# Patient Record
Sex: Female | Born: 1965 | Race: Asian | Hispanic: No | Marital: Married | Smoking: Never smoker
Health system: Southern US, Community
[De-identification: ages and names within clinical notes are randomized; demographics above are authoritative.]

---

## 2016-06-20 ENCOUNTER — Other Ambulatory Visit: Payer: Self-pay | Admitting: *Deleted

## 2016-06-20 DIAGNOSIS — Z1231 Encounter for screening mammogram for malignant neoplasm of breast: Secondary | ICD-10-CM

## 2016-07-01 ENCOUNTER — Ambulatory Visit
Admission: RE | Admit: 2016-07-01 | Discharge: 2016-07-01 | Disposition: A | Discharge status: Home / Self Care | Payer: BLUE CROSS/BLUE SHIELD | Source: Ambulatory Visit | Attending: *Deleted | Admitting: *Deleted

## 2016-07-01 DIAGNOSIS — Z1231 Encounter for screening mammogram for malignant neoplasm of breast: Secondary | ICD-10-CM

## 2017-03-10 DIAGNOSIS — N3090 Cystitis, unspecified without hematuria: Secondary | ICD-10-CM

## 2017-03-10 HISTORY — DX: Cystitis, unspecified without hematuria: N30.90

## 2017-05-31 ENCOUNTER — Other Ambulatory Visit: Payer: Self-pay | Admitting: *Deleted

## 2017-05-31 DIAGNOSIS — Z1231 Encounter for screening mammogram for malignant neoplasm of breast: Secondary | ICD-10-CM

## 2017-07-03 ENCOUNTER — Ambulatory Visit
Admission: RE | Admit: 2017-07-03 | Discharge: 2017-07-03 | Disposition: A | Discharge status: Home / Self Care | Payer: BLUE CROSS/BLUE SHIELD | Source: Ambulatory Visit | Attending: *Deleted | Admitting: *Deleted

## 2017-07-03 DIAGNOSIS — Z1231 Encounter for screening mammogram for malignant neoplasm of breast: Secondary | ICD-10-CM

## 2018-02-28 ENCOUNTER — Encounter (HOSPITAL_BASED_OUTPATIENT_CLINIC_OR_DEPARTMENT_OTHER): Payer: Self-pay | Admitting: *Deleted

## 2018-02-28 ENCOUNTER — Emergency Department (HOSPITAL_BASED_OUTPATIENT_CLINIC_OR_DEPARTMENT_OTHER)
Admission: EM | Admit: 2018-02-28 | Discharge: 2018-02-28 | Disposition: A | Discharge status: Home / Self Care | Payer: BLUE CROSS/BLUE SHIELD | Attending: Emergency Medicine | Admitting: Emergency Medicine

## 2018-02-28 ENCOUNTER — Other Ambulatory Visit: Payer: Self-pay

## 2018-02-28 DIAGNOSIS — J069 Acute upper respiratory infection, unspecified: Secondary | ICD-10-CM

## 2018-02-28 DIAGNOSIS — R070 Pain in throat: Secondary | ICD-10-CM | POA: Insufficient documentation

## 2018-02-28 MED ORDER — GUAIFENESIN-DM 100-10 MG/5ML PO SYRP: 5.0000 mL | ORAL_SOLUTION | Freq: Three times a day (TID) | ORAL | 0 refills | Status: DC | PRN

## 2018-02-28 MED FILL — SM TUSSIN DM SYRUP: 100-10 | 8 days supply | Qty: 118 | Fill #0

## 2018-02-28 NOTE — ED Provider Notes (Signed)
[facility] EMERGENCY DEPARTMENT Provider Note   CSN: [id number] Arrival date & time: 02/28/18  8341    History   Chief Complaint Chief Complaint  Patient presents with  . Cough    HPI Annette Alvarado is a 53 y.o. female.     HPI Patient presents with cough and a sore throat.  Is had for last couple days.  Some mild sputum production.  No fevers.  No myalgias.  No definite sick contacts.  No abdominal pain.  No headache.  No confusion.  States she is otherwise healthy. History reviewed. No pertinent past medical history.  There are no active problems to display for this patient.   History reviewed. No pertinent surgical history.   OB History   No obstetric history on file.      Home Medications    Prior to Admission medications   Medication Sig Start Date End Date Taking? Authorizing Provider  guaiFENesin-dextromethorphan (ROBITUSSIN DM) 100-10 MG/5ML syrup Take 5 mLs by mouth 3 (three) times daily as needed for cough. 02/28/18   Benjiman Core, MD    Family History Family History  Problem Relation Age of Onset  . Breast cancer Mother 14    Social History Social History   Tobacco Use  . Smoking status: Never Smoker  . Smokeless tobacco: Never Used  Substance Use Topics  . Alcohol use: Not on file  . Drug use: Not on file     Allergies   Patient has no known allergies.   Review of Systems Review of Systems  Constitutional: Negative for appetite change.  HENT: Positive for sore throat.   Respiratory: Positive for cough.   Cardiovascular: Negative for chest pain.  Gastrointestinal: Negative for abdominal pain.  Genitourinary: Negative for flank pain.  Musculoskeletal: Negative for arthralgias.  Skin: Negative for wound.  Neurological: Negative for weakness and numbness.  Psychiatric/Behavioral: Negative for confusion.     Physical Exam Updated Vital Signs BP (!) 157/85 (BP Location: Right Arm)   Pulse 83   Temp 98.3 F (36.8 C)  (Oral)   Resp 18   Ht 5\' 1"  (1.549 m)   Wt 54.4 kg   SpO2 100%   BMI 22.67 kg/m   Physical Exam Vitals signs and nursing note reviewed.  HENT:     Head: Normocephalic.     Mouth/Throat:     Pharynx: No oropharyngeal exudate or posterior oropharyngeal erythema.  Eyes:     Pupils: Pupils are equal, round, and reactive to light.  Neck:     Musculoskeletal: No neck rigidity or muscular tenderness.  Pulmonary:     Breath sounds: No wheezing or rhonchi.  Chest:     Chest wall: No tenderness.  Abdominal:     Tenderness: There is no abdominal tenderness.  Musculoskeletal:     Right lower leg: No edema.     Left lower leg: No edema.  Lymphadenopathy:     Cervical: Cervical adenopathy present.  Skin:    General: Skin is warm.     Capillary Refill: Capillary refill takes less than 2 seconds.  Neurological:     General: No focal deficit present.     Mental Status: She is alert.      ED Treatments / Results  Labs (all labs ordered are listed, but only abnormal results are displayed) Labs Reviewed - No data to display  EKG None  Radiology No results found.  Procedures Procedures (including critical care time)  Medications Ordered in ED Medications - No  data to display   Initial Impression / Assessment and Plan / ED Course  I have reviewed the triage vital signs and the nursing notes.  Pertinent labs & imaging results that were available during my care of the patient were reviewed by me and considered in my medical decision making (see chart for details).        Patient with URI symptoms.  Throat reassuring.  Lungs are clear.  Seems minor to be flu.  Low risk.  Will discharge home with symptomatic treatment.  Final Clinical Impressions(s) / ED Diagnoses   Final diagnoses:  Acute upper respiratory infection    ED Discharge Orders         Ordered    guaiFENesin-dextromethorphan (ROBITUSSIN DM) 100-10 MG/5ML syrup  3 times daily PRN     02/28/18 0945             Benjiman Core, MD 02/28/18 [phone]

## 2018-02-28 NOTE — ED Triage Notes (Signed)
Pt reports occasional cough x 2 days, with "just a little bit of sore throat." denies fevers or any other c/o

## 2018-06-27 ENCOUNTER — Other Ambulatory Visit: Payer: Self-pay | Admitting: *Deleted

## 2018-06-27 DIAGNOSIS — Z1231 Encounter for screening mammogram for malignant neoplasm of breast: Secondary | ICD-10-CM

## 2018-07-04 ENCOUNTER — Other Ambulatory Visit: Payer: Self-pay

## 2018-07-04 ENCOUNTER — Encounter: Payer: Self-pay | Admitting: *Deleted

## 2018-07-04 ENCOUNTER — Emergency Department (INDEPENDENT_AMBULATORY_CARE_PROVIDER_SITE_OTHER)
Admission: EM | Admit: 2018-07-04 | Discharge: 2018-07-04 | Disposition: A | Discharge status: Home / Self Care | Payer: BLUE CROSS/BLUE SHIELD | Source: Home / Self Care | Attending: Family Medicine | Admitting: Family Medicine

## 2018-07-04 DIAGNOSIS — R1013 Epigastric pain: Secondary | ICD-10-CM | POA: Diagnosis not present

## 2018-07-04 LAB — POCT CBC W AUTO DIFF (K'VILLE URGENT CARE)

## 2018-07-04 MED ORDER — HYDROCODONE-ACETAMINOPHEN 5-325 MG PO TABS: ORAL_TABLET | ORAL | 0 refills | Status: DC

## 2018-07-04 MED ORDER — OMEPRAZOLE 40 MG PO CPDR: DELAYED_RELEASE_CAPSULE | ORAL | 1 refills | Status: AC

## 2018-07-04 NOTE — ED Provider Notes (Signed)
[facility]    CSN: [id number] Arrival date & time: 07/04/18  [id number]      History   Chief Complaint Chief Complaint  Patient presents with  . Abdominal Pain    HPI Annette Alvarado is a 53 y.o. female.   Patient complains of approximately 2 week history of nausea (without vomiting) and epigastric pain after eating.  The pain also occurs when she has not eaten for a while.  It does not radiate and is not exacerbated by specific foods.  The pain has occurred consistently during the past 4 days.  Recently she has tried Pepcid OTC with improvement, although she has been taking it PRN.   She reports that her bowel movements are generally normal, with occasional constipation.  When she is constipated she sometimes has a small amount of bright red blood on her stool.  She denies melena.  She feels well otherwise. Family history negative for GI problems.  The history is provided by the patient. The history is limited by a language barrier. A language interpreter was used.  Abdominal Pain Pain location:  Epigastric Pain quality: gnawing and sharp   Pain radiation: back. Pain severity:  Moderate Onset quality:  Sudden Duration:  2 weeks Timing:  Intermittent Progression:  Worsening Chronicity:  New Context: awakening from sleep and eating   Context: not alcohol use, not diet changes, not previous surgeries, not recent illness and not recent travel   Relieved by: Pepcid OTC. Worsened by:  Eating Ineffective treatments:  None tried Associated symptoms: hematochezia and nausea   Associated symptoms: no anorexia, no chest pain, no chills, no constipation, no cough, no diarrhea, no dysuria, no fatigue, no fever, no flatus, no hematemesis, no hematuria, no melena, no shortness of breath and no vomiting     History reviewed. Past history of anal fissure  Active problems:  Abdominal pain   Past Surgical History:  Procedure Laterality Date  . HEMORROIDECTOMY      OB History    G!P1; patient is post-menopausal      Home Medications    Prior to Admission medications   Medication Sig Start Date End Date Taking? Authorizing Provider  HYDROcodone-acetaminophen (NORCO/VICODIN) 5-325 MG tablet Take one by mouth at bedtime as needed for pain.  May repeat in 4 to 6 hours PRN 07/04/18   Lattie HawBeese,  A, MD  omeprazole (PRILOSEC) 40 MG capsule Take one cap by mouth once daily, 20 minutes before your evening meal 07/04/18   Lattie HawBeese,  A, MD    Family History Family History  Problem Relation Age of Onset  . Breast cancer Mother 450    Social History Social History   Tobacco Use  . Smoking status: Never Smoker  . Smokeless tobacco: Never Used  Substance Use Topics  . Alcohol use: Never    Frequency: Never  . Drug use: Never     Allergies   Patient has no known allergies.   Review of Systems Review of Systems  Constitutional: Negative for appetite change, chills, diaphoresis, fatigue, fever and unexpected weight change.  HENT: Negative.   Eyes: Negative.   Respiratory: Negative for cough, chest tightness, shortness of breath and wheezing.   Cardiovascular: Negative for chest pain.  Gastrointestinal: Positive for abdominal pain, anal bleeding, hematochezia and nausea. Negative for abdominal distention, anorexia, constipation, diarrhea, flatus, hematemesis, melena and vomiting.  Genitourinary: Negative for dysuria and hematuria.  Musculoskeletal: Negative.   Skin: Negative.   Neurological: Negative.      Physical  Exam Triage Vital Signs ED Triage Vitals  Enc Vitals Group     BP 07/04/18 0904 (!) 150/86     Pulse Rate 07/04/18 0904 64     Resp 07/04/18 0904 16     Temp 07/04/18 0904 97.9 F (36.6 C)     Temp Source 07/04/18 0904 Oral     SpO2 07/04/18 0904 99 %     Weight 07/04/18 0906 118 lb (53.5 kg)     Height --      Head Circumference --      Peak Flow --      Pain Score 07/04/18 0905 6     Pain Loc --      Pain Edu? --      Excl.  in GC? --    No data found.  Updated Vital Signs BP (!) 150/86 (BP Location: Right Arm)   Pulse 64   Temp 97.9 F (36.6 C) (Oral)   Resp 16   Wt 53.5 kg   SpO2 99%   BMI 22.30 kg/m   Visual Acuity Right Eye Distance:   Left Eye Distance:   Bilateral Distance:    Right Eye Near:   Left Eye Near:    Bilateral Near:     Physical Exam Vitals signs and nursing note reviewed.  Constitutional:      General: She is not in acute distress. HENT:     Head: Normocephalic.     Right Ear: External ear normal.     Left Ear: External ear normal.     Nose: Nose normal.     Mouth/Throat:     Pharynx: Oropharynx is clear.  Eyes:     Conjunctiva/sclera: Conjunctivae normal.     Pupils: Pupils are equal, round, and reactive to light.  Cardiovascular:     Heart sounds: Normal heart sounds.  Pulmonary:     Breath sounds: Normal breath sounds.  Abdominal:     General: Abdomen is flat. Bowel sounds are normal.     Palpations: Abdomen is soft. There is no hepatomegaly, splenomegaly or mass.     Tenderness: There is abdominal tenderness in the epigastric area. There is no right CVA tenderness, left CVA tenderness, guarding or rebound.       Comments: There is distinct sub-xiphoid epigastric tenderness to palpation as noted on diagram.   Musculoskeletal:     Right lower leg: No edema.     Left lower leg: No edema.  Lymphadenopathy:     Cervical: No cervical adenopathy.  Skin:    General: Skin is warm and dry.     Findings: No rash.  Neurological:     Mental Status: She is alert.      UC Treatments / Results  Labs (all labs ordered are listed, but only abnormal results are displayed) Labs Reviewed  COMPLETE METABOLIC PANEL WITH GFR  AMYLASE  LIPASE  POCT CBC W AUTO DIFF ([facility]):  WBC10.5; LY 14.8; MO 4.8; GR 80.4; Hgb 14.1; Platelets 290     EKG None  Radiology No results found.  Procedures Procedures (including critical care time)  Medications  Ordered in UC Medications - No data to display  Initial Impression / Assessment and Plan / UC Course  I have reviewed the triage vital signs and the nursing notes.  Pertinent labs & imaging results that were available during my care of the patient were reviewed by me and considered in my medical decision making (see chart for details).    ?  gastric ulcer; ?GERD Begin empiric omeprazole 40mg  daily. CMP, amylase, lipase pending.  CBC unremarkable. Patient requests pain medication for night-time use. Rx given for Lortab for bedtime (#10, no refill) Controlled Substance Prescriptions I have consulted the [place] Controlled Substances Registry for this patient, and feel the risk/benefit ratio today is favorable for proceeding with this prescription for a controlled substance.   Recommend follow-up with PCP in about 2 weeks. If symptoms become significantly worse during the night or over the weekend, proceed to the local emergency room.    Final Clinical Impressions(s) / UC Diagnoses   Final diagnoses:  Abdominal pain, epigastric     Discharge Instructions     Avoid eating meals or snacks at bedtime.   ED Prescriptions    Medication Sig Dispense Auth. Provider   omeprazole (PRILOSEC) 40 MG capsule Take one cap by mouth once daily, 20 minutes before your evening meal 30 capsule Kandra Nicolas, MD   HYDROcodone-acetaminophen (NORCO/VICODIN) 5-325 MG tablet Take one by mouth at bedtime as needed for pain.  May repeat in 4 to 6 hours PRN 10 tablet Assunta Found Ishmael Holter, MD         Kandra Nicolas, MD 07/04/18 1017

## 2018-07-04 NOTE — ED Triage Notes (Signed)
Pt c/o intermittent epigastric pain x 2 wks. She reports having a similar pain 2 years ago and it went away. She reports nausea when the pain occurs. The pain is worse when she doesn't eat for a long time or right after she eats.

## 2018-07-04 NOTE — Discharge Instructions (Signed)
Avoid eating meals or snacks at bedtime.

## 2018-07-05 ENCOUNTER — Inpatient Hospital Stay (HOSPITAL_COMMUNITY)
Admission: EM | Admit: 2018-07-05 | Discharge: 2018-07-08 | DRG: 419 | Disposition: A | Discharge status: Home / Self Care | Payer: BLUE CROSS/BLUE SHIELD | Attending: Internal Medicine | Admitting: Internal Medicine

## 2018-07-05 ENCOUNTER — Encounter (HOSPITAL_COMMUNITY): Payer: Self-pay | Admitting: Emergency Medicine

## 2018-07-05 ENCOUNTER — Telehealth: Payer: Self-pay

## 2018-07-05 ENCOUNTER — Other Ambulatory Visit: Payer: Self-pay

## 2018-07-05 DIAGNOSIS — R945 Abnormal results of liver function studies: Secondary | ICD-10-CM | POA: Diagnosis present

## 2018-07-05 DIAGNOSIS — R7989 Other specified abnormal findings of blood chemistry: Secondary | ICD-10-CM

## 2018-07-05 DIAGNOSIS — Z8249 Family history of ischemic heart disease and other diseases of the circulatory system: Secondary | ICD-10-CM

## 2018-07-05 DIAGNOSIS — K851 Biliary acute pancreatitis without necrosis or infection: Secondary | ICD-10-CM | POA: Diagnosis not present

## 2018-07-05 DIAGNOSIS — K859 Acute pancreatitis without necrosis or infection, unspecified: Secondary | ICD-10-CM | POA: Diagnosis present

## 2018-07-05 DIAGNOSIS — K8020 Calculus of gallbladder without cholecystitis without obstruction: Secondary | ICD-10-CM

## 2018-07-05 DIAGNOSIS — K8070 Calculus of gallbladder and bile duct without cholecystitis without obstruction: Secondary | ICD-10-CM | POA: Diagnosis present

## 2018-07-05 DIAGNOSIS — Z1159 Encounter for screening for other viral diseases: Secondary | ICD-10-CM

## 2018-07-05 DIAGNOSIS — E876 Hypokalemia: Secondary | ICD-10-CM | POA: Diagnosis present

## 2018-07-05 DIAGNOSIS — Z803 Family history of malignant neoplasm of breast: Secondary | ICD-10-CM

## 2018-07-05 LAB — COMPLETE METABOLIC PANEL WITH GFR
AG Ratio: 1.3 (calc) (ref 1.0–2.5)
ALT: 442 U/L — ABNORMAL HIGH (ref 6–29)
AST: 688 U/L — ABNORMAL HIGH (ref 10–35)
Albumin: 4.3 g/dL (ref 3.6–5.1)
Alkaline phosphatase (APISO): 207 U/L — ABNORMAL HIGH (ref 37–153)
BUN/Creatinine Ratio: 38 (calc) — ABNORMAL HIGH (ref 6–22)
BUN: 18 mg/dL (ref 7–25)
CO2: 27 mmol/L (ref 20–32)
Calcium: 9.6 mg/dL (ref 8.6–10.4)
Chloride: 103 mmol/L (ref 98–110)
Creat: 0.47 mg/dL — ABNORMAL LOW (ref 0.50–1.05)
GFR, Est African American: 133 mL/min/{1.73_m2} (ref >=60)
GFR, Est Non African American: 115 mL/min/{1.73_m2} (ref >=60)
Globulin: 3.3 g/dL (calc) (ref 1.9–3.7)
Glucose, Bld: 125 mg/dL — ABNORMAL HIGH (ref 65–99)
Potassium: 4.5 mmol/L (ref 3.5–5.3)
Sodium: 138 mmol/L (ref 135–146)
Total Bilirubin: 0.8 mg/dL (ref 0.2–1.2)
Total Protein: 7.6 g/dL (ref 6.1–8.1)

## 2018-07-05 LAB — AMYLASE: Amylase: 2590 U/L — ABNORMAL HIGH (ref 21–101)

## 2018-07-05 LAB — LIPASE: Lipase: >6000 U/L — ABNORMAL HIGH (ref 7–60)

## 2018-07-05 MED ORDER — SODIUM CHLORIDE 0.9% FLUSH
3.0000 mL | Freq: Once | INTRAVENOUS | Status: AC
  Administered 2018-07-06: 3 mL via INTRAVENOUS

## 2018-07-05 NOTE — Telephone Encounter (Signed)
Called patient per Dr Assunta Found that liver enzymes are high and she has acute pancreatitis.  Told her she needs to go to the ER.  Also texted this information to her due to language barrier.

## 2018-07-05 NOTE — ED Provider Notes (Signed)
[facility]-EMERGENCY DEPT Provider Note: J. Lane Analei Whinery, MD, FACEP  CSN: [id number] MRN: [id number] ARRIVAL: 07/05/18 at 2213 [facility]OM: [facility]   CHIEF COMPLAINT  Abnormal Lab   HISTORY OF PRESENT ILLNESS  07/05/18 11:56 PM Annette Alvarado is a 53 y.o. female who [facility] for a 2-week history of nausea without vomiting and epigastric pain after eating.  The pain had been intermittent but became persistent for the previous 4 days.  The pain was moderate when seen yesterday.  She has been trying Pepcid as needed with some improvement.  Laboratory studies were obtained and her lipase came back as greater than 6000, amylase 2590, and elevated alkaline phosphatase and transaminases.  She is here now because she was told her laboratory studies were abnormal and she needed further evaluation.  She denies abdominal pain, nausea, vomiting or any other symptom at the present time.   History reviewed. No pertinent past medical history.  Past Surgical History:  Procedure Laterality Date  . HEMORROIDECTOMY      Family History  Problem Relation Age of Onset  . Breast cancer Mother 8250    Social History   Tobacco Use  . Smoking status: Never Smoker  . Smokeless tobacco: Never Used  Substance Use Topics  . Alcohol use: Never    Frequency: Never  . Drug use: Never    Prior to Admission medications   Medication Sig Start Date End Date Taking? Authorizing Provider  HYDROcodone-acetaminophen (NORCO/VICODIN) 5-325 MG tablet Take one by mouth at bedtime as needed for pain.  May repeat in 4 to 6 hours PRN 07/04/18  Yes Lattie HawBeese, Stephen A, MD  omeprazole (PRILOSEC) 40 MG capsule Take one cap by mouth once daily, 20 minutes before your evening meal 07/04/18  Yes Lattie HawBeese, Stephen A, MD    Allergies Patient has no known allergies.   REVIEW OF SYSTEMS  Negative except as noted here or in the History of Present Illness.   PHYSICAL EXAMINATION  Initial Vital Signs Blood  pressure (!) 173/81, pulse 89, temperature 98.6 F (37 C), temperature source Oral, resp. rate 16, height 5\' 1"  (1.549 m), weight 53.5 kg, SpO2 100 %.  Examination General: Well-developed, well-nourished female in no acute distress; appearance consistent with age of record HENT: normocephalic; atraumatic Eyes: pupils equal, round and reactive to light; extraocular muscles intact Neck: supple Heart: regular rate and rhythm Lungs: clear to auscultation bilaterally Abdomen: soft; nondistended; nontender; no masses or hepatosplenomegaly; bowel sounds present Extremities: No deformity; full range of motion; pulses normal Neurologic: Awake, alert and oriented; motor function intact in all extremities and symmetric; no facial droop Skin: Warm and dry Psychiatric: Normal mood and affect   RESULTS  Summary of this visit's results, reviewed by myself:   EKG Interpretation  Date/Time:    Ventricular Rate:    PR Interval:    QRS Duration:   QT Interval:    QTC Calculation:   R Axis:     Text Interpretation:        Laboratory Studies: Results for orders placed or performed during the hospital encounter of 07/05/18 (from the past 24 hour(s))  Lipase, blood     Status: Abnormal   Collection Time: 07/06/18 12:10 AM  Result Value Ref Range   Lipase 61 (H) 11 - 51 U/L  Comprehensive metabolic panel     Status: Abnormal   Collection Time: 07/06/18 12:10 AM  Result Value Ref Range   Sodium 142 135 - 145 mmol/L  Potassium 3.5 3.5 - 5.1 mmol/L   Chloride 108 98 - 111 mmol/L   CO2 23 22 - 32 mmol/L   Glucose, Bld 101 (H) 70 - 99 mg/dL   BUN 11 6 - 20 mg/dL   Creatinine, Ser 1.610.47 0.44 - 1.00 mg/dL   Calcium 9.4 8.9 - 09.610.3 mg/dL   Total Protein 7.8 6.5 - 8.1 g/dL   Albumin 4.0 3.5 - 5.0 g/dL   AST 045135 (H) 15 - 41 U/L   ALT 304 (H) 0 - 44 U/L   Alkaline Phosphatase 184 (H) 38 - 126 U/L   Total Bilirubin 0.4 0.3 - 1.2 mg/dL   GFR calc non Af Amer >60 >60 mL/min   GFR calc Af Amer >60  >60 mL/min   Anion gap 11 5 - 15  Urinalysis, Routine w reflex microscopic     Status: Abnormal   Collection Time: 07/06/18 12:10 AM  Result Value Ref Range   Color, Urine STRAW (A) YELLOW   APPearance CLEAR CLEAR   Specific Gravity, Urine 1.005 1.005 - 1.030   pH 6.0 5.0 - 8.0   Glucose, UA 150 (A) NEGATIVE mg/dL   Hgb urine dipstick SMALL (A) NEGATIVE   Bilirubin Urine NEGATIVE NEGATIVE   Ketones, ur NEGATIVE NEGATIVE mg/dL   Protein, ur NEGATIVE NEGATIVE mg/dL   Nitrite NEGATIVE NEGATIVE   Leukocytes,Ua MODERATE (A) NEGATIVE   RBC / HPF 0-5 0 - 5 RBC/hpf   WBC, UA 0-5 0 - 5 WBC/hpf   Bacteria, UA RARE (A) NONE SEEN   Squamous Epithelial / LPF 0-5 0 - 5   Mucus PRESENT   CBC with Differential/Platelet     Status: None   Collection Time: 07/06/18 12:10 AM  Result Value Ref Range   WBC 9.1 4.0 - 10.5 K/uL   RBC 4.29 3.87 - 5.11 MIL/uL   Hemoglobin 12.4 12.0 - 15.0 g/dL   HCT 40.938.1 81.136.0 - 91.446.0 %   MCV 88.8 80.0 - 100.0 fL   MCH 28.9 26.0 - 34.0 pg   MCHC 32.5 30.0 - 36.0 g/dL   RDW 78.213.3 95.611.5 - 21.315.5 %   Platelets 263 150 - 400 K/uL   nRBC 0.0 0.0 - 0.2 %   Neutrophils Relative % 69 %   Neutro Abs 6.3 1.7 - 7.7 K/uL   Lymphocytes Relative 22 %   Lymphs Abs 2.0 0.7 - 4.0 K/uL   Monocytes Relative 5 %   Monocytes Absolute 0.5 0.1 - 1.0 K/uL   Eosinophils Relative 4 %   Eosinophils Absolute 0.4 0.0 - 0.5 K/uL   Basophils Relative 0 %   Basophils Absolute 0.0 0.0 - 0.1 K/uL   Immature Granulocytes 0 %   Abs Immature Granulocytes 0.02 0.00 - 0.07 K/uL   Imaging Studies: Ct Abdomen Pelvis W Contrast  Result Date: 07/06/2018 CLINICAL DATA:  53 year old female with epigastric pain and elevated lipase. EXAM: CT ABDOMEN AND PELVIS WITH CONTRAST TECHNIQUE: Multidetector CT imaging of the abdomen and pelvis was performed using the standard protocol following bolus administration of intravenous contrast. CONTRAST:  80mL OMNIPAQUE IOHEXOL 300 MG/ML  SOLN COMPARISON:  None. FINDINGS:  Evaluation of this exam is limited due to respiratory motion artifact. Lower chest: Minimal bibasilar dependent atelectatic changes. The visualized lung bases are otherwise clear. No intra-abdominal free air or free fluid. Hepatobiliary: The liver is unremarkable. No intrahepatic biliary ductal dilatation. The gallbladder is unremarkable. Pancreas: There diffuse inflammatory changes of the pancreas consistent with pancreatitis. No fluid collection/abscess  or pseudocyst. Spleen: Normal in size without focal abnormality. Adrenals/Urinary Tract: Adrenal glands are unremarkable. Kidneys are normal, without renal calculi, focal lesion, or hydronephrosis. Bladder is unremarkable. Stomach/Bowel: There is no bowel obstruction or active inflammation. Normal appendix. Moderate colonic stool burden. Vascular/Lymphatic: No significant vascular findings are present. No enlarged abdominal or pelvic lymph nodes. Reproductive: Uterus and bilateral adnexa are unremarkable. Other: None Musculoskeletal: No acute or significant osseous findings. IMPRESSION: Acute pancreatitis.  No abscess or pseudocyst. Electronically Signed   By: Anner Crete M.D.   On: 07/06/2018 03:11    ED COURSE and MDM  Nursing notes and initial vitals signs, including pulse oximetry, reviewed.  Vitals:   07/06/18 0030 07/06/18 0100 07/06/18 0130 07/06/18 0200  BP: (!) 151/82 138/81 (!) 158/78 135/81  Pulse: 74  76 67  Resp: 15     Temp:      TempSrc:      SpO2: 98%  99% 99%  Weight:      Height:       3:21 AM The patient's lipase is gone from greater than 6000 yesterday to a normal 61 today.  She is also pain-free but her CT scan is consistent with acute pancreatitis.  She may have an intermittent biliary duct occlusion.  We will have her admitted for further evaluation and possible ERCP.  PROCEDURES    ED DIAGNOSES     ICD-10-CM   1. Acute pancreatitis without infection or necrosis, unspecified pancreatitis type  K85.90   2.  Elevated liver function tests  R94.5        Kahlee Metivier, Jenny Reichmann, MD 07/06/18 [phone]

## 2018-07-05 NOTE — ED Triage Notes (Signed)
Patient here from home with complaints of abnormal labs. Reports that she went to urgent care on yesterday and they called her today and said that she has possible pancreatitis and liver enzymes are elevated. Denies any pain currently.

## 2018-07-06 ENCOUNTER — Encounter (HOSPITAL_COMMUNITY): Payer: Self-pay

## 2018-07-06 ENCOUNTER — Emergency Department (HOSPITAL_COMMUNITY): Payer: BLUE CROSS/BLUE SHIELD

## 2018-07-06 ENCOUNTER — Observation Stay (HOSPITAL_COMMUNITY): Payer: BLUE CROSS/BLUE SHIELD

## 2018-07-06 DIAGNOSIS — K859 Acute pancreatitis without necrosis or infection, unspecified: Secondary | ICD-10-CM | POA: Diagnosis not present

## 2018-07-06 DIAGNOSIS — R945 Abnormal results of liver function studies: Secondary | ICD-10-CM | POA: Diagnosis present

## 2018-07-06 LAB — COMPREHENSIVE METABOLIC PANEL
ALT: 304 U/L — ABNORMAL HIGH (ref 0–44)
AST: 135 U/L — ABNORMAL HIGH (ref 15–41)
Albumin: 4 g/dL (ref 3.5–5.0)
Alkaline Phosphatase: 184 U/L — ABNORMAL HIGH (ref 38–126)
Anion gap: 11 (ref 5–15)
BUN: 11 mg/dL (ref 6–20)
CO2: 23 mmol/L (ref 22–32)
Calcium: 9.4 mg/dL (ref 8.9–10.3)
Chloride: 108 mmol/L (ref 98–111)
Creatinine, Ser: 0.47 mg/dL (ref 0.44–1.00)
GFR calc Af Amer: >60 mL/min (ref >60)
GFR calc non Af Amer: >60 mL/min (ref >60)
Glucose, Bld: 101 mg/dL — ABNORMAL HIGH (ref 70–99)
Potassium: 3.5 mmol/L (ref 3.5–5.1)
Sodium: 142 mmol/L (ref 135–145)
Total Bilirubin: 0.4 mg/dL (ref 0.3–1.2)
Total Protein: 7.8 g/dL (ref 6.5–8.1)

## 2018-07-06 LAB — URINALYSIS, ROUTINE W REFLEX MICROSCOPIC
Bilirubin Urine: NEGATIVE
Glucose, UA: 150 mg/dL — AB
Ketones, ur: NEGATIVE mg/dL
Nitrite: NEGATIVE
Protein, ur: NEGATIVE mg/dL
Specific Gravity, Urine: 1.005 (ref 1.005–1.030)
pH: 6 (ref 5.0–8.0)

## 2018-07-06 LAB — LIPASE, BLOOD: Lipase: 61 U/L — ABNORMAL HIGH (ref 11–51)

## 2018-07-06 LAB — CBC WITH DIFFERENTIAL/PLATELET
Abs Immature Granulocytes: 0.02 10*3/uL (ref 0.00–0.07)
Basophils Absolute: 0 10*3/uL (ref 0.0–0.1)
Basophils Relative: 0 %
Eosinophils Absolute: 0.4 10*3/uL (ref 0.0–0.5)
Eosinophils Relative: 4 %
HCT: 38.1 % (ref 36.0–46.0)
Hemoglobin: 12.4 g/dL (ref 12.0–15.0)
Immature Granulocytes: 0 %
Lymphocytes Relative: 22 %
Lymphs Abs: 2 10*3/uL (ref 0.7–4.0)
MCH: 28.9 pg (ref 26.0–34.0)
MCHC: 32.5 g/dL (ref 30.0–36.0)
MCV: 88.8 fL (ref 80.0–100.0)
Monocytes Absolute: 0.5 10*3/uL (ref 0.1–1.0)
Monocytes Relative: 5 %
Neutro Abs: 6.3 10*3/uL (ref 1.7–7.7)
Neutrophils Relative %: 69 %
Platelets: 263 10*3/uL (ref 150–400)
RBC: 4.29 MIL/uL (ref 3.87–5.11)
RDW: 13.3 % (ref 11.5–15.5)
WBC: 9.1 10*3/uL (ref 4.0–10.5)
nRBC: 0 % (ref 0.0–0.2)

## 2018-07-06 LAB — LACTATE DEHYDROGENASE: LDH: 185 U/L (ref 98–192)

## 2018-07-06 LAB — LIPID PANEL
Cholesterol: 210 mg/dL — ABNORMAL HIGH (ref 0–200)
HDL: 70 mg/dL (ref >40)
LDL Cholesterol: 130 mg/dL — ABNORMAL HIGH (ref 0–99)
Total CHOL/HDL Ratio: 3 RATIO
Triglycerides: 49 mg/dL (ref <150)
VLDL: 10 mg/dL (ref 0–40)

## 2018-07-06 LAB — SURGICAL PCR SCREEN
MRSA, PCR: NEGATIVE
Staphylococcus aureus: NEGATIVE

## 2018-07-06 LAB — HIV ANTIBODY (ROUTINE TESTING W REFLEX): HIV Screen 4th Generation wRfx: NONREACTIVE

## 2018-07-06 LAB — SARS CORONAVIRUS 2 BY RT PCR (HOSPITAL ORDER, PERFORMED IN ~~LOC~~ HOSPITAL LAB): SARS Coronavirus 2: NEGATIVE

## 2018-07-06 MED ORDER — CHLORHEXIDINE GLUCONATE CLOTH 2 % EX PADS
6.0000 | MEDICATED_PAD | Freq: Once | CUTANEOUS | Status: AC
  Administered 2018-07-06: 6 via TOPICAL

## 2018-07-06 MED ORDER — ACETAMINOPHEN 500 MG PO TABS
1000.0000 mg | ORAL_TABLET | ORAL | Status: AC
  Administered 2018-07-07: 1000 mg via ORAL
  Filled 2018-07-06: qty 2

## 2018-07-06 MED ORDER — HYDROMORPHONE HCL 1 MG/ML IJ SOLN
0.5000 mg | INTRAMUSCULAR | Status: DC | PRN
  Administered 2018-07-07: 0.5 mg via INTRAVENOUS
  Filled 2018-07-06 (×2): qty 0.5

## 2018-07-06 MED ORDER — ENOXAPARIN SODIUM 40 MG/0.4ML ~~LOC~~ SOLN
40.0000 mg | Freq: Every day | SUBCUTANEOUS | Status: DC
  Administered 2018-07-06: 40 mg via SUBCUTANEOUS
  Filled 2018-07-06: qty 0.4

## 2018-07-06 MED ORDER — SODIUM CHLORIDE (PF) 0.9 % IJ SOLN
INTRAMUSCULAR | Status: AC
  Filled 2018-07-06: qty 50

## 2018-07-06 MED ORDER — ONDANSETRON HCL 4 MG/2ML IJ SOLN: 4.0000 mg | Freq: Four times a day (QID) | INTRAMUSCULAR | Status: DC | PRN

## 2018-07-06 MED ORDER — CELECOXIB 200 MG PO CAPS
200.0000 mg | ORAL_CAPSULE | ORAL | Status: AC
  Administered 2018-07-07: 200 mg via ORAL
  Filled 2018-07-06: qty 1

## 2018-07-06 MED ORDER — CHLORHEXIDINE GLUCONATE CLOTH 2 % EX PADS: 6.0000 | MEDICATED_PAD | Freq: Once | CUTANEOUS | Status: DC

## 2018-07-06 MED ORDER — GABAPENTIN 300 MG PO CAPS
300.0000 mg | ORAL_CAPSULE | ORAL | Status: AC
  Administered 2018-07-07: 300 mg via ORAL
  Filled 2018-07-06: qty 1

## 2018-07-06 MED ORDER — IOHEXOL 300 MG/ML  SOLN
30.0000 mL | Freq: Once | INTRAMUSCULAR | Status: AC | PRN
  Administered 2018-07-06: 30 mL via ORAL

## 2018-07-06 MED ORDER — SODIUM CHLORIDE 0.9 % IV SOLN
INTRAVENOUS | Status: AC
  Administered 2018-07-06: 05:00:00 via INTRAVENOUS

## 2018-07-06 MED ORDER — SODIUM CHLORIDE 0.9 % IV SOLN
2.0000 g | INTRAVENOUS | Status: AC
  Administered 2018-07-07: 2 g via INTRAVENOUS
  Filled 2018-07-06: qty 2

## 2018-07-06 MED ORDER — IOHEXOL 300 MG/ML  SOLN
100.0000 mL | Freq: Once | INTRAMUSCULAR | Status: AC | PRN
  Administered 2018-07-06: 80 mL via INTRAVENOUS

## 2018-07-06 NOTE — ED Notes (Signed)
Translator Passio [phone]

## 2018-07-06 NOTE — Consult Note (Signed)
[facility] Consult Note  Annette Alvarado 06/23/1965  [id number].    Requesting MD: Dr. Albertine GratesFang Xu Chief Complaint/Reason for Consult: Gallstone Pancreatitis   HPI: Annette Alvarado is a 53 y.o. female who was recently admitted for gallstone pancreatitis were asked to see for consultation.  Patient reports that on Tuesday she began having some epigastric abdominal pain along with nausea after eating a large dinner.  She reports she went to an urgent care where she was prescribed a medication for acid reflux.  She took this without any relief.  Her laboratory studies that were taken at the urgent care came back with a lipase greater than 6000, amylase 2590 and elevated LFTs.  She was asked to go to the emergency department.  She presented on 6/25 and was admitted for gallstone pancreatitis.  Since admission her abdominal pain and nausea have resolved.  She is currently on a clear liquid diet with advance as tolerated order.  Her lipase this morning was 61.  LFTs are downtrending.  T bili 0.4.  Ultrasound showed cholelithiasis without evidence of cholecystitis.  Common bile duct was 3 mm and unremarkable. She denies any associated fever, chills, chest pain, shortness of breath, cough, emesis, urinary symptoms, constipation or diarrhea.  She does note that she has had similar epigastric and right upper quadrant abdominal pain that occur after eating for the last few weeks.  This usually occurs after a large meal and will last several hours before resolving on its own.  Patient denies any prior abdominal surgeries.  She does have a hysterectomy listed as her OB/GYN status in Epic however is unsure if this was abdominal or vaginal.  She is not on any blood thinners.  No other significant past medical history.      ROS: Review of Systems  Constitutional: Negative for chills and fever.  Respiratory: Negative for cough and shortness of breath.   Cardiovascular: Negative for chest pain.  Gastrointestinal:  Positive for abdominal pain and nausea. Negative for blood in stool, constipation, diarrhea, melena and vomiting.  Genitourinary: Negative for dysuria.  All other systems reviewed and are negative. All systems reviewed and otherwise negative except for as above  Family History  Problem Relation Age of Onset  . Breast cancer Mother 5250    Past Medical History:  Diagnosis Date  . Cystitis 03/2017    Past Surgical History:  Procedure Laterality Date  . COLONOSCOPY  02/2012   Performed in [place], records unavailable.  Repeat colonoscopy due 02/2017  . HEMORROIDECTOMY      Social History:  reports that she has never smoked. She has never used smokeless tobacco. She reports that she does not drink alcohol or use drugs.  Patient lives at home with her husband Annette Alvarado. She denies any alcohol use She denies any tobacco use in her lifetime She denies any illicit drug use Patient works at a nail salon  Allergies: No Known Allergies  Medications Prior to Admission  Medication Sig Dispense Refill  . HYDROcodone-acetaminophen (NORCO/VICODIN) 5-325 MG tablet Take one by mouth at bedtime as needed for pain.  May repeat in 4 to 6 hours PRN 10 tablet 0  . omeprazole (PRILOSEC) 40 MG capsule Take one cap by mouth once daily, 20 minutes before your evening meal 30 capsule 1    Prior to Admission medications   Medication Sig Start Date End Date Taking? Authorizing Provider  HYDROcodone-acetaminophen (NORCO/VICODIN) 5-325 MG tablet Take one by mouth at bedtime as needed for pain.  May  repeat in 4 to 6 hours PRN 07/04/18  Yes Lattie Haw, MD  omeprazole (PRILOSEC) 40 MG capsule Take one cap by mouth once daily, 20 minutes before your evening meal 07/04/18  Yes Lattie Haw, MD    Blood pressure 137/85, pulse 72, temperature 98.2 F (36.8 C), temperature source Oral, resp. rate 14, height  (1.549 m), weight 53.5 kg, SpO2 99 %. Physical Exam: General: pleasant, WD/WN female who is  laying in bed in NAD HEENT: head is normocephalic, atraumatic.  Sclera are noninjected. No icterus. Pupils equal and round.  Ears and nose without any masses or lesions.  Mouth is pink and moist. Dentition fair Heart: regular, rate, and rhythm.  No obvious murmurs, gallops, or rubs noted.  Palpable pedal pulses bilaterally Lungs: CTAB, no wheezes, rhonchi, or rales noted.  Respiratory effort nonlabored Abd: Soft, NT/ND, +BS, no masses, hernias, or organomegaly MS: all 4 extremities are symmetrical with no cyanosis, clubbing, or edema. Skin: warm and dry with no masses, lesions, or rashes Psych: A&Ox3 with an appropriate affect. Neuro: cranial nerves grossly intact, extremity CSM intact bilaterally, normal speech  Results for orders placed or performed during the hospital encounter of 07/05/18 (from the past 48 hour(s))  Lipid panel     Status: Abnormal   Collection Time: 07/06/18 12:09 AM  Result Value Ref Range   Cholesterol 210 (H) 0 - 200 mg/dL   Triglycerides 49 <324 mg/dL   HDL 70 >40 mg/dL   Total CHOL/HDL Ratio 3.0 RATIO   VLDL 10 0 - 40 mg/dL   LDL Cholesterol 102 (H) 0 - 99 mg/dL    Comment:        Total Cholesterol/HDL:CHD Risk Coronary Heart Disease Risk Table                     Men   Women  1/2 Average Risk   3.4   3.3  Average Risk       5.0   4.4  2 X Average Risk   9.6   7.1  3 X Average Risk  23.4   11.0        Use the calculated Patient Ratio above and the CHD Risk Table to determine the patient's CHD Risk.        ATP III CLASSIFICATION (LDL):  <100     mg/dL   Optimal  725-366  mg/dL   Near or Above                    Optimal  130-159  mg/dL   Borderline  440-347  mg/dL   High  >425     mg/dL   Very High Performed at [facility], [address], [place], [place] [address]   Lactate dehydrogenase     Status: None   Collection Time: 07/06/18 12:09 AM  Result Value Ref Range   LDH 185 98 - 192 U/L    Comment: Performed at [facility], [address] [place], [place] [address]  Lipase, blood     Status: Abnormal   Collection Time: 07/06/18 12:10 AM  Result Value Ref Range   Lipase 61 (H) 11 - 51 U/L    Comment: Performed at [facility], [address], [place], [place] [address]  Comprehensive metabolic panel     Status: Abnormal   Collection Time: 07/06/18 12:10 AM  Result Value Ref Range   Sodium 142 135 -  145 mmol/L   Potassium 3.5 3.5 - 5.1 mmol/L   Chloride 108 98 - 111 mmol/L   CO2 23 22 - 32 mmol/L   Glucose, Bld 101 (H) 70 - 99 mg/dL   BUN 11 6 - 20 mg/dL   Creatinine, Ser 1.610.47 0.44 - 1.00 mg/dL   Calcium 9.4 8.9 - 09.610.3 mg/dL   Total Protein 7.8 6.5 - 8.1 g/dL   Albumin 4.0 3.5 - 5.0 g/dL   AST 045135 (H) 15 - 41 U/L   ALT 304 (H) 0 - 44 U/L   Alkaline Phosphatase 184 (H) 38 - 126 U/L   Total Bilirubin 0.4 0.3 - 1.2 mg/dL   GFR calc non Af Amer >60 >60 mL/min   GFR calc Af Amer >60 >60 mL/min   Anion gap 11 5 - 15    Comment: Performed at [facility], [address] [place], [place] [address]  Urinalysis, Routine w reflex microscopic     Status: Abnormal   Collection Time: 07/06/18 12:10 AM  Result Value Ref Range   Color, Urine STRAW (A) YELLOW   APPearance CLEAR CLEAR   Specific Gravity, Urine 1.005 1.005 - 1.030   pH 6.0 5.0 - 8.0   Glucose, UA 150 (A) NEGATIVE mg/dL   Hgb urine dipstick SMALL (A) NEGATIVE   Bilirubin Urine NEGATIVE NEGATIVE   Ketones, ur NEGATIVE NEGATIVE mg/dL   Protein, ur NEGATIVE NEGATIVE mg/dL   Nitrite NEGATIVE NEGATIVE   Leukocytes,Ua MODERATE (A) NEGATIVE   RBC / HPF 0-5 0 - 5 RBC/hpf   WBC, UA 0-5 0 - 5 WBC/hpf   Bacteria, UA RARE (A) NONE SEEN   Squamous Epithelial / LPF 0-5 0 - 5   Mucus PRESENT     Comment: Performed at [facility], [address] [place], [place] [address]  CBC with Differential/Platelet     Status: None   Collection Time: 07/06/18 12:10 AM  Result Value Ref  Range   WBC 9.1 4.0 - 10.5 K/uL   RBC 4.29 3.87 - 5.11 MIL/uL   Hemoglobin 12.4 12.0 - 15.0 g/dL   HCT 82.938.1 56.236.0 - 13.046.0 %   MCV 88.8 80.0 - 100.0 fL   MCH 28.9 26.0 - 34.0 pg   MCHC 32.5 30.0 - 36.0 g/dL   RDW 86.513.3 78.411.5 - 69.615.5 %   Platelets 263 150 - 400 K/uL   nRBC 0.0 0.0 - 0.2 %   Neutrophils Relative % 69 %   Neutro Abs 6.3 1.7 - 7.7 K/uL   Lymphocytes Relative 22 %   Lymphs Abs 2.0 0.7 - 4.0 K/uL   Monocytes Relative 5 %   Monocytes Absolute 0.5 0.1 - 1.0 K/uL   Eosinophils Relative 4 %   Eosinophils Absolute 0.4 0.0 - 0.5 K/uL   Basophils Relative 0 %   Basophils Absolute 0.0 0.0 - 0.1 K/uL   Immature Granulocytes 0 %   Abs Immature Granulocytes 0.02 0.00 - 0.07 K/uL    Comment: Performed at [facility], [address] [place], [place] [address]  SARS Coronavirus 2 (CEPHEID - Performed in [facility] hospital lab), Hosp Order     Status: None   Collection Time: 07/06/18  4:00 AM   Specimen: Nasopharyngeal Swab  Result Value Ref Range   SARS Coronavirus 2 NEGATIVE NEGATIVE    Comment: (NOTE) If result is NEGATIVE SARS-CoV-2 target nucleic acids are NOT DETECTED. The SARS-CoV-2 RNA is generally detectable in upper and lower  respiratory specimens during the acute phase of infection. The lowest  concentration of SARS-CoV-2 viral copies this assay can detect is 250  copies / mL. A negative result does not preclude SARS-CoV-2 infection  and should not be used as the sole basis for treatment or other  patient management decisions.  A negative result may occur with  improper specimen collection / handling, submission of specimen other  than nasopharyngeal swab, presence of viral mutation(s) within the  areas targeted by this assay, and inadequate number of viral copies  (<250 copies / mL). A negative result must be combined with clinical  observations, patient history, and epidemiological information. If result is POSITIVE SARS-CoV-2 target nucleic  acids are DETECTED. The SARS-CoV-2 RNA is generally detectable in upper and lower  respiratory specimens dur ing the acute phase of infection.  Positive  results are indicative of active infection with SARS-CoV-2.  Clinical  correlation with patient history and other diagnostic information is  necessary to determine patient infection status.  Positive results [place]  not rule out bacterial infection or co-infection with other viruses. If result is PRESUMPTIVE POSTIVE SARS-CoV-2 nucleic acids MAY BE PRESENT.   A presumptive positive result was obtained on the submitted specimen  and confirmed on repeat testing.  While 2019 novel coronavirus  (SARS-CoV-2) nucleic acids may be present in the submitted sample  additional confirmatory testing may be necessary for epidemiological  and / or clinical management purposes  to differentiate between  SARS-CoV-2 and other Sarbecovirus currently known to infect humans.  If clinically indicated additional testing with an alternate test  methodology [phone]([id number]) is advised. The SARS-CoV-2 RNA is generally  detectable in upper and lower respiratory sp ecimens during the acute  phase of infection. The expected result is Negative. Fact Sheet for Patients:  BoilerBrush.com.cyhttps://www.fda.gov/media/136312/download Fact Sheet for Healthcare Providers: https://pope.com/https://www.fda.gov/media/136313/download This test is not yet approved or cleared by the [place] FDA and has been authorized for detection and/or diagnosis of SARS-CoV-2 by FDA under an Emergency Use Authorization (EUA).  This EUA will remain in effect (meaning this test can be used) for the duration of the COVID-19 declaration under Section 564(b)(1) of the Act, 21 U.S.C. section 360bbb-3(b)(1), unless the authorization is terminated or revoked sooner. Performed at [facility], [address] [place], [place] [address]   HIV Antibody (routine testing w rflx)     Status: None   Collection Time:  07/06/18  5:59 AM  Result Value Ref Range   HIV Screen 4th Generation wRfx Non Reactive Non Reactive    Comment: (NOTE) Performed At: [facility] [address] [place], [place] [address] Jolene SchimkeNagendra Sanjai MD NF:[id number]:[phone]    Ct Abdomen Pelvis W Contrast  Result Date: 07/06/2018 CLINICAL DATA:  53 year old female with epigastric pain and elevated lipase. EXAM: CT ABDOMEN AND PELVIS WITH CONTRAST TECHNIQUE: Multidetector CT imaging of the abdomen and pelvis was performed using the standard protocol following bolus administration of intravenous contrast. CONTRAST:  80mL OMNIPAQUE IOHEXOL 300 MG/ML  SOLN COMPARISON:  None. FINDINGS: Evaluation of this exam is limited due to respiratory motion artifact. Lower chest: Minimal bibasilar dependent atelectatic changes. The visualized lung bases are otherwise clear. No intra-abdominal free air or free fluid. Hepatobiliary: The liver is unremarkable. No intrahepatic biliary ductal dilatation. The gallbladder is unremarkable. Pancreas: There diffuse inflammatory changes of the pancreas consistent with pancreatitis. No fluid collection/abscess or pseudocyst. Spleen: Normal in size without focal abnormality. Adrenals/Urinary Tract: Adrenal glands are unremarkable. Kidneys are normal, without renal  calculi, focal lesion, or hydronephrosis. Bladder is unremarkable. Stomach/Bowel: There is no bowel obstruction or active inflammation. Normal appendix. Moderate colonic stool burden. Vascular/Lymphatic: No significant vascular findings are present. No enlarged abdominal or pelvic lymph nodes. Reproductive: Uterus and bilateral adnexa are unremarkable. Other: None Musculoskeletal: No acute or significant osseous findings. IMPRESSION: Acute pancreatitis.  No abscess or pseudocyst. Electronically Signed   By: Anner Crete M.D.   On: 07/06/2018 03:11   US Abdomen Limited Ruq  Result Date: 07/06/2018 CLINICAL DATA:  Pancreatitis EXAM: ULTRASOUND ABDOMEN LIMITED  RIGHT UPPER QUADRANT COMPARISON:  None. FINDINGS: Gallbladder: Multiple small stones layer in the dependent aspect of the gallbladder measured up to 5.7 mm diameter. No gallbladder wall thickening or pericholecystic fluid. Sonographer describes no sonographic Murphy's sign. Common bile duct: Diameter: 3 mm, unremarkable Liver: No focal lesion identified. Within normal limits in parenchymal echogenicity. Portal vein is patent on color Doppler imaging with normal direction of blood flow towards the liver. IMPRESSION: Cholelithiasis Electronically Signed   By: Lucrezia Europe M.D.   On: 07/06/2018 10:35    Anti-infectives (From admission, onward)   None       Assessment/Plan Gallstone Pancreatitis - Lipase downtrending, 6000->61 - LFTs downtrending. T bili wnl. CBD on US wnl.  -Will plan for laparoscopic cholecystectomy with Dr. Marlou Starks tomorrow morning - Okay for CLD. N.p.o. after midnight  ID - Rocephin per-op VTE - SCD, Lovenox (hold AM dose) FEN - CLD currently, NPO after midnight  Jillyn Ledger, [facility] 07/06/2018, 3:12 PM Pager: [phone]

## 2018-07-06 NOTE — Progress Notes (Addendum)
PROGRESS NOTE  Annette Alvarado XFG:[id number] DOB: Jun 06, 1965 DOA: 07/05/2018 PCP: Darden Amber, PA  HPI/Recap of past 24 hours:  Ab pain has resolved, lipase and lft coming down,  No fever, no leukocytosis  Assessment/Plan: Principal Problem:   Abnormal liver function Active Problems:   Acute pancreatitis  Gallstone pancreatitis: -pain resolved, lipase/lft coming down -ab [place] no CBD dilatation -GI consulted who recommend to proceed with surgery consult -general surgery consulted   SARS COV2 screening negative  Code Status: full  Family Communication: patient   Disposition Plan: not ready to discharge   Consultants:  GI  General surgery  Procedures:  Lap chole planned for tomorrow  Antibiotics:  perioperative    Objective: BP 137/85 (BP Location: Right Arm)   Pulse 72   Temp 98.2 F (36.8 C) (Oral)   Resp 14   Ht 5\' 1"  (1.549 m)   Wt 53.5 kg   SpO2 99%   BMI 22.30 kg/m   Intake/Output Summary (Last 24 hours) at 07/06/2018 1343 Last data filed at 07/06/2018 1126 Gross per 24 hour  Intake 3 ml  Output 400 ml  Net -397 ml   Filed Weights   07/05/18 2253  Weight: 53.5 kg    Exam: Patient is examined daily including today on 07/06/2018, exams remain the same as of yesterday except that has changed    General:  NAD  Cardiovascular: RRR  Respiratory: CTABL  Abdomen: Soft/ND/NT, positive BS  Musculoskeletal: No Edema  Neuro: alert, oriented   Data Reviewed: Basic Metabolic Panel: Recent Labs  Lab 07/04/18 0953 07/06/18 0010  NA 138 142  K 4.5 3.5  CL 103 108  CO2 27 23  GLUCOSE 125* 101*  BUN 18 11  CREATININE 0.47* 0.47  CALCIUM 9.6 9.4   Liver Function Tests: Recent Labs  Lab 07/04/18 0953 07/06/18 0010  AST 688* 135*  ALT 442* 304*  ALKPHOS  --  184*  BILITOT 0.8 0.4  PROT 7.6 7.8  ALBUMIN  --  4.0   Recent Labs  Lab 07/04/18 0953 07/06/18 0010  LIPASE >6,000* 61*  AMYLASE 2,590*  --    No results for  input(s): AMMONIA in the last 168 hours. CBC: Recent Labs  Lab 07/06/18 0010  WBC 9.1  NEUTROABS 6.3  HGB 12.4  HCT 38.1  MCV 88.8  PLT 263   Cardiac Enzymes:   No results for input(s): CKTOTAL, CKMB, CKMBINDEX, TROPONINI in the last 168 hours. BNP (last 3 results) No results for input(s): BNP in the last 8760 hours.  ProBNP (last 3 results) No results for input(s): PROBNP in the last 8760 hours.  CBG: No results for input(s): GLUCAP in the last 168 hours.  Recent Results (from the past 240 hour(s))  SARS Coronavirus 2 (CEPHEID - Performed in [place] hospital lab), Hosp Order     Status: None   Collection Time: 07/06/18  4:00 AM   Specimen: Nasopharyngeal Swab  Result Value Ref Range Status   SARS Coronavirus 2 NEGATIVE NEGATIVE Final    Comment: (NOTE) If result is NEGATIVE SARS-CoV-2 target nucleic acids are NOT DETECTED. The SARS-CoV-2 RNA is generally detectable in upper and lower  respiratory specimens during the acute phase of infection. The lowest  concentration of SARS-CoV-2 viral copies this assay can detect is 250  copies / mL. A negative result does not preclude SARS-CoV-2 infection  and should not be used as the sole basis for treatment or other  patient management decisions.  A  negative result may occur with  improper specimen collection / handling, submission of specimen other  than nasopharyngeal swab, presence of viral mutation(s) within the  areas targeted by this assay, and inadequate number of viral copies  (<250 copies / mL). A negative result must be combined with clinical  observations, patient history, and epidemiological information. If result is POSITIVE SARS-CoV-2 target nucleic acids are DETECTED. The SARS-CoV-2 RNA is generally detectable in upper and lower  respiratory specimens dur ing the acute phase of infection.  Positive  results are indicative of active infection with SARS-CoV-2.  Clinical  correlation with patient history and  other diagnostic information is  necessary to determine patient infection status.  Positive results [place]  not rule out bacterial infection or co-infection with other viruses. If result is PRESUMPTIVE POSTIVE SARS-CoV-2 nucleic acids MAY BE PRESENT.   A presumptive positive result was obtained on the submitted specimen  and confirmed on repeat testing.  While 2019 novel coronavirus  (SARS-CoV-2) nucleic acids may be present in the submitted sample  additional confirmatory testing may be necessary for epidemiological  and / or clinical management purposes  to differentiate between  SARS-CoV-2 and other Sarbecovirus currently known to infect humans.  If clinically indicated additional testing with an alternate test  methodology [phone] is advised. The SARS-CoV-2 RNA is generally  detectable in upper and lower respiratory sp ecimens during the acute  phase of infection. The expected result is Negative. Fact Sheet for Patients:  BoilerBrush.com.cyhttps://www.fda.gov/media/136312/download Fact Sheet for Healthcare Providers: https://pope.com/https://www.fda.gov/media/136313/download This test is not yet approved or cleared by the [place] FDA and has been authorized for detection and/or diagnosis of SARS-CoV-2 by FDA under an Emergency Use Authorization (EUA).  This EUA will remain in effect (meaning this test can be used) for the duration of the COVID-19 declaration under Section 564(b)(1) of the Act, 21 U.S.C. section 360bbb-3(b)(1), unless the authorization is terminated or revoked sooner. Performed at [facility], [address] [place], [place] [address]      Studies: Ct Abdomen Pelvis W Contrast  Result Date: 07/06/2018 CLINICAL DATA:  53 year old female with epigastric pain and elevated lipase. EXAM: CT ABDOMEN AND PELVIS WITH CONTRAST TECHNIQUE: Multidetector CT imaging of the abdomen and pelvis was performed using the standard protocol following bolus administration of intravenous  contrast. CONTRAST:  80mL OMNIPAQUE IOHEXOL 300 MG/ML  SOLN COMPARISON:  None. FINDINGS: Evaluation of this exam is limited due to respiratory motion artifact. Lower chest: Minimal bibasilar dependent atelectatic changes. The visualized lung bases are otherwise clear. No intra-abdominal free air or free fluid. Hepatobiliary: The liver is unremarkable. No intrahepatic biliary ductal dilatation. The gallbladder is unremarkable. Pancreas: There diffuse inflammatory changes of the pancreas consistent with pancreatitis. No fluid collection/abscess or pseudocyst. Spleen: Normal in size without focal abnormality. Adrenals/Urinary Tract: Adrenal glands are unremarkable. Kidneys are normal, without renal calculi, focal lesion, or hydronephrosis. Bladder is unremarkable. Stomach/Bowel: There is no bowel obstruction or active inflammation. Normal appendix. Moderate colonic stool burden. Vascular/Lymphatic: No significant vascular findings are present. No enlarged abdominal or pelvic lymph nodes. Reproductive: Uterus and bilateral adnexa are unremarkable. Other: None Musculoskeletal: No acute or significant osseous findings. IMPRESSION: Acute pancreatitis.  No abscess or pseudocyst. Electronically Signed   By: Elgie CollardArash  Radparvar M.D.   On: 07/06/2018 03:11   [place] Abdomen Limited Ruq  Result Date: 07/06/2018 CLINICAL DATA:  Pancreatitis EXAM: ULTRASOUND ABDOMEN LIMITED RIGHT UPPER QUADRANT COMPARISON:  None. FINDINGS: Gallbladder: Multiple small stones layer in the dependent aspect  of the gallbladder measured up to 5.7 mm diameter. No gallbladder wall thickening or pericholecystic fluid. Sonographer describes no sonographic Murphy's sign. Common bile duct: Diameter: 3 mm, unremarkable Liver: No focal lesion identified. Within normal limits in parenchymal echogenicity. Portal vein is patent on color Doppler imaging with normal direction of blood flow towards the liver. IMPRESSION: Cholelithiasis Electronically Signed   By: Corlis Leak   Hassell M.D.   On: 07/06/2018 10:35    Scheduled Meds: . enoxaparin (LOVENOX) injection  40 mg Subcutaneous Daily  . sodium chloride (PF)        Continuous Infusions: . sodium chloride 100 mL/hr at 07/06/18 [id number]     Time spent: 35mins, case discussed with GI and general surgery I have personally reviewed and interpreted on  07/06/2018 daily labs, imagings as discussed above under date review session and assessment and plans.  I reviewed all nursing notes, pharmacy notes, consultant notes,  vitals, pertinent old records  I have discussed plan of care as described above with RN , patient on 07/06/2018   Albertine GratesFang Elic Vencill MD, PhD  [facility] Pager [phone]. If 7PM-7AM, please contact night-coverage at [email or web address], password [email or web address] 07/06/2018, 1:43 PM  LOS: 0 days

## 2018-07-06 NOTE — Anesthesia Preprocedure Evaluation (Addendum)
Anesthesia Evaluation  Patient identified by MRN, date of birth, ID band Patient awake    Reviewed: Allergy & Precautions, NPO status , Patient's Chart, lab work & pertinent test results  History of Anesthesia Complications Negative for: history of anesthetic complications  Airway Mallampati: II  TM Distance: >3 FB Neck ROM: Full    Dental  (+) Dental Advisory Given   Pulmonary neg pulmonary ROS,  07/06/2018 SARS coronavirus NEG   breath sounds clear to auscultation       Cardiovascular negative cardio ROS   Rhythm:Regular Rate:Normal     Neuro/Psych negative neurological ROS     GI/Hepatic GERD  Medicated,Abnormal LFTs with acute cholecystitis   Endo/Other  negative endocrine ROS  Renal/GU negative Renal ROS     Musculoskeletal   Abdominal   Peds  Hematology negative hematology ROS (+)   Anesthesia Other Findings   Reproductive/Obstetrics Pt stated menopausal since age 50                           Anesthesia Physical Anesthesia Plan  ASA: II  Anesthesia Plan: General   Post-op Pain Management:    Induction: Intravenous  PONV Risk Score and Plan: 4 or greater and Scopolamine patch - Pre-op, Dexamethasone and Ondansetron  Airway Management Planned: Oral ETT  Additional Equipment:   Intra-op Plan:   Post-operative Plan: Extubation in OR  Informed Consent: I have reviewed the patients History and Physical, chart, labs and discussed the procedure including the risks, benefits and alternatives for the proposed anesthesia with the patient or authorized representative who has indicated his/her understanding and acceptance.     Dental advisory given  Plan Discussed with: CRNA and Surgeon  Anesthesia Plan Comments:        Anesthesia Quick Evaluation

## 2018-07-06 NOTE — H&P (Signed)
TRH H&P    Patient Demographics:    Annette Alvarado, is a 53 y.o. female  MRN: [id number]  DOB - [birth year]  Admit Date - 07/05/2018  Referring MD/NP/PA:  Shanon Rosser  Outpatient Primary MD for the patient is Darden Amber, [place]  Patient coming from: home  Chief complaint-  Pancreatitis, abnormal liver function   HPI:    Annette Alvarado  is a 53 y.o. female, who apparently went to urgent care yesterday and was told that she had pancreatitis and needed to come to ER for evaluation.  Pt currently denies fever, chills, abd pain, n/v, diarrhea, brbpr, black stool  In ED T 98.6,  P 67  Bp 135/81  Pox 99%  Wbc 9.1, Hgb 12.4, Plt 263 Na 142, K 3.5,  Bun 11, Creatinine 0.47   CT abd/ pelvis IMPRESSION: Acute pancreatitis.  No abscess or pseudocyst.  Pt will be admitted for observation of acute pancreatitis and abnormal liver function testing.    Review of systems:    In addition to the HPI above,  No Fever-chills, No Headache, No changes with Vision or hearing, No problems swallowing food or Liquids, No Chest pain, Cough or Shortness of Breath, No Abdominal pain, No Nausea or Vomiting, bowel movements are regular, No Blood in stool or Urine, No dysuria, No new skin rashes or bruises, No new joints pains-aches,  No new weakness, tingling, numbness in any extremity, No recent weight gain or loss, No polyuria, polydypsia or polyphagia, No significant Mental Stressors.  All other systems reviewed and are negative.    Past History of the following :    History reviewed. No pertinent past medical history.    Past Surgical History:  Procedure Laterality Date   HEMORROIDECTOMY        Social History:      Social History   Tobacco Use   Smoking status: Never Smoker   Smokeless tobacco: Never Used  Substance Use Topics   Alcohol use: Never    Frequency: Never       Family History :     Family History  Problem Relation Age of Onset   Breast cancer Mother 57       Home Medications:   Prior to Admission medications   Medication Sig Start Date End Date Taking? Authorizing Provider  HYDROcodone-acetaminophen (NORCO/VICODIN) 5-325 MG tablet Take one by mouth at bedtime as needed for pain.  May repeat in 4 to 6 hours PRN 07/04/18  Yes Kandra Nicolas, MD  omeprazole (PRILOSEC) 40 MG capsule Take one cap by mouth once daily, 20 minutes before your evening meal 07/04/18  Yes Kandra Nicolas, MD     Allergies:    No Known Allergies   Physical Exam:   Vitals  Blood pressure 135/81, pulse 67, temperature 98.6 F (37 C), temperature source Oral, resp. rate 15, height 5\' 1"  (1.549 m), weight 53.5 kg, SpO2 99 %.  1.  General: axox3  2. Psychiatric: euthymic  3. Neurologic: cn2-12 intact, reflexes 2+ symmetric, diffuse with no clonus,  motor 5/5 in all 4 ext  4. HEENMT:  Anicteric, pupils 1.195mm symmetric, direct, consensual, near intact Neck: no jvd  5. Respiratory : CTAB  6. Cardiovascular : rrr s1, s2,   7. Gastrointestinal:  Abd: soft, nt, nd, +bs  8. Skin:  Ext: no c/c/e,  No rash  9.Musculoskeletal:  Good ROM  No adenopathy    Data Review:    CBC Recent Labs  Lab 07/06/18 0010  WBC 9.1  HGB 12.4  HCT 38.1  PLT 263  MCV 88.8  MCH 28.9  MCHC 32.5  RDW 13.3  LYMPHSABS 2.0  MONOABS 0.5  EOSABS 0.4  BASOSABS 0.0   ------------------------------------------------------------------------------------------------------------------  Results for orders placed or performed during the hospital encounter of 07/05/18 (from the past 48 hour(s))  Lipase, blood     Status: Abnormal   Collection Time: 07/06/18 12:10 AM  Result Value Ref Range   Lipase 61 (H) 11 - 51 U/L    Comment: Performed at [facility], [address] [place], [place] [address]  Comprehensive metabolic panel     Status: Abnormal   Collection Time:  07/06/18 12:10 AM  Result Value Ref Range   Sodium 142 135 - 145 mmol/L   Potassium 3.5 3.5 - 5.1 mmol/L   Chloride 108 98 - 111 mmol/L   CO2 23 22 - 32 mmol/L   Glucose, Bld 101 (H) 70 - 99 mg/dL   BUN 11 6 - 20 mg/dL   Creatinine, Ser 2.950.47 0.44 - 1.00 mg/dL   Calcium 9.4 8.9 - 62.110.3 mg/dL   Total Protein 7.8 6.5 - 8.1 g/dL   Albumin 4.0 3.5 - 5.0 g/dL   AST 308135 (H) 15 - 41 U/L   ALT 304 (H) 0 - 44 U/L   Alkaline Phosphatase 184 (H) 38 - 126 U/L   Total Bilirubin 0.4 0.3 - 1.2 mg/dL   GFR calc non Af Amer >60 >60 mL/min   GFR calc Af Amer >60 >60 mL/min   Anion gap 11 5 - 15    Comment: Performed at [facility], [address] [place], [place] [address]  Urinalysis, Routine w reflex microscopic     Status: Abnormal   Collection Time: 07/06/18 12:10 AM  Result Value Ref Range   Color, Urine STRAW (A) YELLOW   APPearance CLEAR CLEAR   Specific Gravity, Urine 1.005 1.005 - 1.030   pH 6.0 5.0 - 8.0   Glucose, UA 150 (A) NEGATIVE mg/dL   Hgb urine dipstick SMALL (A) NEGATIVE   Bilirubin Urine NEGATIVE NEGATIVE   Ketones, ur NEGATIVE NEGATIVE mg/dL   Protein, ur NEGATIVE NEGATIVE mg/dL   Nitrite NEGATIVE NEGATIVE   Leukocytes,Ua MODERATE (A) NEGATIVE   RBC / HPF 0-5 0 - 5 RBC/hpf   WBC, UA 0-5 0 - 5 WBC/hpf   Bacteria, UA RARE (A) NONE SEEN   Squamous Epithelial / LPF 0-5 0 - 5   Mucus PRESENT     Comment: Performed at [facility], [address] [place], [place] [address]  CBC with Differential/Platelet     Status: None   Collection Time: 07/06/18 12:10 AM  Result Value Ref Range   WBC 9.1 4.0 - 10.5 K/uL   RBC 4.29 3.87 - 5.11 MIL/uL   Hemoglobin 12.4 12.0 - 15.0 g/dL   HCT 52.838.1 41.336.0 - 24.446.0 %   MCV 88.8 80.0 - 100.0 fL   MCH 28.9 26.0 - 34.0 pg   MCHC 32.5  30.0 - 36.0 g/dL   RDW 52.813.3 41.311.5 - 24.415.5 %   Platelets 263 150 - 400 K/uL   nRBC 0.0 0.0 - 0.2 %   Neutrophils Relative % 69 %   Neutro Abs 6.3 1.7 - 7.7 K/uL   Lymphocytes  Relative 22 %   Lymphs Abs 2.0 0.7 - 4.0 K/uL   Monocytes Relative 5 %   Monocytes Absolute 0.5 0.1 - 1.0 K/uL   Eosinophils Relative 4 %   Eosinophils Absolute 0.4 0.0 - 0.5 K/uL   Basophils Relative 0 %   Basophils Absolute 0.0 0.0 - 0.1 K/uL   Immature Granulocytes 0 %   Abs Immature Granulocytes 0.02 0.00 - 0.07 K/uL    Comment: Performed at [facility], [address] [place], [place] [address]    Chemistries  Recent Labs  Lab 07/04/18 [phone] 07/06/18 0010  NA 138 142  K 4.5 3.5  CL 103 108  CO2 27 23  GLUCOSE 125* 101*  BUN 18 11  CREATININE 0.47* 0.47  CALCIUM 9.6 9.4  AST 688* 135*  ALT 442* 304*  ALKPHOS  --  184*  BILITOT 0.8 0.4   ------------------------------------------------------------------------------------------------------------------  ------------------------------------------------------------------------------------------------------------------ GFR: Estimated Creatinine Clearance: 63.5 mL/min (by C-G formula based on SCr of 0.47 mg/dL). Liver Function Tests: Recent Labs  Lab 07/04/18 0953 07/06/18 0010  AST 688* 135*  ALT 442* 304*  ALKPHOS  --  184*  BILITOT 0.8 0.4  PROT 7.6 7.8  ALBUMIN  --  4.0   Recent Labs  Lab 07/04/18 0953 07/06/18 0010  LIPASE >6,000* 61*  AMYLASE 2,590*  --    No results for input(s): AMMONIA in the last 168 hours. Coagulation Profile: No results for input(s): INR, PROTIME in the last 168 hours. Cardiac Enzymes: No results for input(s): CKTOTAL, CKMB, CKMBINDEX, TROPONINI in the last 168 hours. BNP (last 3 results) No results for input(s): PROBNP in the last 8760 hours. HbA1C: No results for input(s): HGBA1C in the last 72 hours. CBG: No results for input(s): GLUCAP in the last 168 hours. Lipid Profile: No results for input(s): CHOL, HDL, LDLCALC, TRIG, CHOLHDL, LDLDIRECT in the last 72 hours. Thyroid Function Tests: No results for input(s): TSH, T4TOTAL, FREET4, T3FREE,  THYROIDAB in the last 72 hours. Anemia Panel: No results for input(s): VITAMINB12, FOLATE, FERRITIN, TIBC, IRON, RETICCTPCT in the last 72 hours.  --------------------------------------------------------------------------------------------------------------- Urine analysis:    Component Value Date/Time   COLORURINE STRAW (A) 07/06/2018 0010   APPEARANCEUR CLEAR 07/06/2018 0010   LABSPEC 1.005 07/06/2018 0010   PHURINE 6.0 07/06/2018 0010   GLUCOSEU 150 (A) 07/06/2018 0010   HGBUR SMALL (A) 07/06/2018 0010   BILIRUBINUR NEGATIVE 07/06/2018 0010   KETONESUR NEGATIVE 07/06/2018 0010   PROTEINUR NEGATIVE 07/06/2018 0010   NITRITE NEGATIVE 07/06/2018 0010   LEUKOCYTESUR MODERATE (A) 07/06/2018 0010      Imaging Results:    Ct Abdomen Pelvis W Contrast  Result Date: 07/06/2018 CLINICAL DATA:  53 year old female with epigastric pain and elevated lipase. EXAM: CT ABDOMEN AND PELVIS WITH CONTRAST TECHNIQUE: Multidetector CT imaging of the abdomen and pelvis was performed using the standard protocol following bolus administration of intravenous contrast. CONTRAST:  80mL OMNIPAQUE IOHEXOL 300 MG/ML  SOLN COMPARISON:  None. FINDINGS: Evaluation of this exam is limited due to respiratory motion artifact. Lower chest: Minimal bibasilar dependent atelectatic changes. The visualized lung bases are otherwise clear. No intra-abdominal free air or free fluid. Hepatobiliary: The liver is unremarkable. No intrahepatic biliary ductal  dilatation. The gallbladder is unremarkable. Pancreas: There diffuse inflammatory changes of the pancreas consistent with pancreatitis. No fluid collection/abscess or pseudocyst. Spleen: Normal in size without focal abnormality. Adrenals/Urinary Tract: Adrenal glands are unremarkable. Kidneys are normal, without renal calculi, focal lesion, or hydronephrosis. Bladder is unremarkable. Stomach/Bowel: There is no bowel obstruction or active inflammation. Normal appendix. Moderate  colonic stool burden. Vascular/Lymphatic: No significant vascular findings are present. No enlarged abdominal or pelvic lymph nodes. Reproductive: Uterus and bilateral adnexa are unremarkable. Other: None Musculoskeletal: No acute or significant osseous findings. IMPRESSION: Acute pancreatitis.  No abscess or pseudocyst. Electronically Signed   By: Elgie CollardArash  Radparvar M.D.   On: 07/06/2018 03:11       Assessment & Plan:    Principal Problem:   Abnormal liver function Active Problems:   Acute pancreatitis  Acute pancreatitis NPO Ns iv Check LDH, Lipid Check cbc, cmp , lipase tomorrow AM Please consult GI in am regarding ? Any need for ERCP, appears to have passed a stone  Abnormal liver function Check acute hepatitis panel   DVT Prophylaxis-   Lovenox - SCDs   AM Labs Ordered, also please review Full Orders  Family Communication: Admission, patients condition and plan of care including tests being ordered have been discussed with the patient  who indicate understanding and agree with the plan and Code Status.  Code Status:  FULL CODE, d/w husband, that pt will be admitted for pancreatitis  Admission status: Observation: Based on patients clinical presentation and evaluation of above clinical data, I have made determination that patient meets observation criteria at this time.  Time spent in minutes : 55   Pearson GrippeJames Meghna Hagmann M.D on 07/06/2018 at 3:35 AM

## 2018-07-06 NOTE — Plan of Care (Signed)
Plan of care reviewed and discussed with the patient. 

## 2018-07-06 NOTE — ED Notes (Signed)
ED TO INPATIENT HANDOFF REPORT  ED Nurse Name and Phone #: Hilaria OtaKatie  S Name/Age/Gender Annette Alvarado 53 y.o. female Room/Bed: WA21/WA21  Code Status   Code Status: Full Code  Home/SNF/Other Home Patient oriented to: self, place, time and situation Is this baseline? Yes   Triage Complete: Triage complete  Chief Complaint Abdominal Pain  Triage Note Patient here from home with complaints of abnormal labs. Reports that she went to urgent care on yesterday and they called her today and said that she has possible pancreatitis and liver enzymes are elevated. Denies any pain currently.    Allergies No Known Allergies  Level of Care/Admitting Diagnosis ED Disposition    ED Disposition Condition Comment   Admit  [facility] [[id number]]  Level of Care: Med-Surg [16]  Covid Evaluation: Screening Protocol (No Symptoms)  Diagnosis: Abnormal liver function [914782][317767]  Admitting Physician: Pearson GrippeKIM, JAMES [[id number]]  Attending Physician: Pearson GrippeKIM, JAMES [[id number]]  PT Class (Gerstel Not Modify): Observation [104]  PT Acc Code (Brandis Not Modify): Observation [10022]       B Medical/Surgery History History reviewed. No pertinent past medical history. Past Surgical History:  Procedure Laterality Date  . HEMORROIDECTOMY       A IV Location/Drains/Wounds Patient Lines/Drains/Airways Status   Active Line/Drains/Airways    Name:   Placement date:   Placement time:   Site:   Days:   Peripheral IV 07/06/18 Left Antecubital   07/06/18    0026    Antecubital   less than 1          Intake/Output Last 24 hours  Intake/Output Summary (Last 24 hours) at 07/06/2018 0441 Last data filed at 07/06/2018 0401 Gross per 24 hour  Intake 3 ml  Output -  Net 3 ml    Labs/Imaging Results for orders placed or performed during the hospital encounter of 07/05/18 (from the past 48 hour(s))  Lipid panel     Status: Abnormal   Collection Time: 07/06/18 12:09 AM  Result Value Ref Range   Cholesterol 210 (H) 0 - 200 mg/dL   Triglycerides 49 <956<150 mg/dL   HDL 70 >21>40 mg/dL   Total CHOL/HDL Ratio 3.0 RATIO   VLDL 10 0 - 40 mg/dL   LDL Cholesterol 308130 (H) 0 - 99 mg/dL    Comment:        Total Cholesterol/HDL:CHD Risk Coronary Heart Disease Risk Table                     Men   Women  1/2 Average Risk   3.4   3.3  Average Risk       5.0   4.4  2 X Average Risk   9.6   7.1  3 X Average Risk  23.4   11.0        Use the calculated Patient Ratio above and the CHD Risk Table to determine the patient's CHD Risk.        ATP III CLASSIFICATION (LDL):  <100     mg/dL   Optimal  [phone]  mg/dL   Near or Above                    Optimal  130-159  mg/dL   Borderline  [phone]  mg/dL   High  >841>190     mg/dL   Very High Performed at [facility], [address] [place], [place] [address]   Lactate dehydrogenase  Status: None   Collection Time: 07/06/18 12:09 AM  Result Value Ref Range   LDH 185 98 - 192 U/L    Comment: Performed at [facility], [place] [address], [place], [place] [address]  Lipase, blood     Status: Abnormal   Collection Time: 07/06/18 12:10 AM  Result Value Ref Range   Lipase 61 (H) 11 - 51 U/L    Comment: Performed at [facility], [place] [address], [place], [place] [address]  Comprehensive metabolic panel     Status: Abnormal   Collection Time: 07/06/18 12:10 AM  Result Value Ref Range   Sodium 142 135 - 145 mmol/L   Potassium 3.5 3.5 - 5.1 mmol/L   Chloride 108 98 - 111 mmol/L   CO2 23 22 - 32 mmol/L   Glucose, Bld 101 (H) 70 - 99 mg/dL   BUN 11 6 - 20 mg/dL   Creatinine, Ser 0.47 0.44 - 1.00 mg/dL   Calcium 9.4 8.9 - 10.3 mg/dL   Total Protein 7.8 6.5 - 8.1 g/dL   Albumin 4.0 3.5 - 5.0 g/dL   AST 135 (H) 15 - 41 U/L   ALT 304 (H) 0 - 44 U/L   Alkaline Phosphatase 184 (H) 38 - 126 U/L   Total Bilirubin 0.4 0.3 - 1.2 mg/dL   GFR calc non Af Amer >60 >60 mL/min   GFR calc Af Amer >60 >60  mL/min   Anion gap 11 5 - 15    Comment: Performed at [facility], [place] [address], [place], [place] [address]  Urinalysis, Routine w reflex microscopic     Status: Abnormal   Collection Time: 07/06/18 12:10 AM  Result Value Ref Range   Color, Urine STRAW (A) YELLOW   APPearance CLEAR CLEAR   Specific Gravity, Urine 1.005 1.005 - 1.030   pH 6.0 5.0 - 8.0   Glucose, UA 150 (A) NEGATIVE mg/dL   Hgb urine dipstick SMALL (A) NEGATIVE   Bilirubin Urine NEGATIVE NEGATIVE   Ketones, ur NEGATIVE NEGATIVE mg/dL   Protein, ur NEGATIVE NEGATIVE mg/dL   Nitrite NEGATIVE NEGATIVE   Leukocytes,Ua MODERATE (A) NEGATIVE   RBC / HPF 0-5 0 - 5 RBC/hpf   WBC, UA 0-5 0 - 5 WBC/hpf   Bacteria, UA RARE (A) NONE SEEN   Squamous Epithelial / LPF 0-5 0 - 5   Mucus PRESENT     Comment: Performed at [facility], [place] [address], [place], [place] [address]  CBC with Differential/Platelet     Status: None   Collection Time: 07/06/18 12:10 AM  Result Value Ref Range   WBC 9.1 4.0 - 10.5 K/uL   RBC 4.29 3.87 - 5.11 MIL/uL   Hemoglobin 12.4 12.0 - 15.0 g/dL   HCT 38.1 36.0 - 46.0 %   MCV 88.8 80.0 - 100.0 fL   MCH 28.9 26.0 - 34.0 pg   MCHC 32.5 30.0 - 36.0 g/dL   RDW 13.3 11.5 - 15.5 %   Platelets 263 150 - 400 K/uL   nRBC 0.0 0.0 - 0.2 %   Neutrophils Relative % 69 %   Neutro Abs 6.3 1.7 - 7.7 K/uL   Lymphocytes Relative 22 %   Lymphs Abs 2.0 0.7 - 4.0 K/uL   Monocytes Relative 5 %   Monocytes Absolute 0.5 0.1 - 1.0 K/uL   Eosinophils Relative 4 %   Eosinophils Absolute 0.4 0.0 - 0.5 K/uL   Basophils Relative 0 %  Basophils Absolute 0.0 0.0 - 0.1 K/uL   Immature Granulocytes 0 %   Abs Immature Granulocytes 0.02 0.00 - 0.07 K/uL    Comment: Performed at [facility], [address] [place], [place] [address]   Ct Abdomen Pelvis W Contrast  Result Date: 07/06/2018 CLINICAL DATA:  53 year old female with epigastric pain and elevated  lipase. EXAM: CT ABDOMEN AND PELVIS WITH CONTRAST TECHNIQUE: Multidetector CT imaging of the abdomen and pelvis was performed using the standard protocol following bolus administration of intravenous contrast. CONTRAST:  80mL OMNIPAQUE IOHEXOL 300 MG/ML  SOLN COMPARISON:  None. FINDINGS: Evaluation of this exam is limited due to respiratory motion artifact. Lower chest: Minimal bibasilar dependent atelectatic changes. The visualized lung bases are otherwise clear. No intra-abdominal free air or free fluid. Hepatobiliary: The liver is unremarkable. No intrahepatic biliary ductal dilatation. The gallbladder is unremarkable. Pancreas: There diffuse inflammatory changes of the pancreas consistent with pancreatitis. No fluid collection/abscess or pseudocyst. Spleen: Normal in size without focal abnormality. Adrenals/Urinary Tract: Adrenal glands are unremarkable. Kidneys are normal, without renal calculi, focal lesion, or hydronephrosis. Bladder is unremarkable. Stomach/Bowel: There is no bowel obstruction or active inflammation. Normal appendix. Moderate colonic stool burden. Vascular/Lymphatic: No significant vascular findings are present. No enlarged abdominal or pelvic lymph nodes. Reproductive: Uterus and bilateral adnexa are unremarkable. Other: None Musculoskeletal: No acute or significant osseous findings. IMPRESSION: Acute pancreatitis.  No abscess or pseudocyst. Electronically Signed   By: Elgie CollardArash  Radparvar M.D.   On: 07/06/2018 03:11    Pending Labs Unresulted Labs (From admission, onward)    Start     Ordered   07/13/18 0500  Creatinine, serum  (enoxaparin (LOVENOX)    CrCl >/= 30 ml/min)  Weekly,   R    Comments: while on enoxaparin therapy    07/06/18 0334   07/07/18 0500  Comprehensive metabolic panel  Tomorrow morning,   R     07/06/18 0334   07/07/18 0500  CBC  Tomorrow morning,   R     07/06/18 0334   07/07/18 0500  Lipase, blood  Tomorrow morning,   R     07/06/18 0334   07/06/18 0400   Hepatitis panel, acute  Once,   R     07/06/18 0400   07/06/18 0400  HIV Antibody (routine testing w rflx)  Once,   R     07/06/18 0400   07/06/18 0324  SARS Coronavirus 2 (CEPHEID - Performed in [facility] hospital lab), Hosp Order  (Asymptomatic Patients Labs)  Once,   STAT    Question:  Rule Out  Answer:  Yes   07/06/18 0323          Vitals/Pain Today's Vitals   07/06/18 0330 07/06/18 0400 07/06/18 0402 07/06/18 0430  BP: 133/85  123/87 136/87  Pulse: 66  87 77  Resp:   16   Temp:      TempSrc:      SpO2: 97%  97% 97%  Weight:      Height:      PainSc:  0-No pain      Isolation Precautions No active isolations  Medications Medications  sodium chloride (PF) 0.9 % injection (has no administration in time range)  enoxaparin (LOVENOX) injection 40 mg (has no administration in time range)  0.9 %  sodium chloride infusion (has no administration in time range)  ondansetron (ZOFRAN) injection 4 mg (has no administration in time range)  HYDROmorphone (DILAUDID) injection 0.5 mg (has no  administration in time range)  sodium chloride flush (NS) 0.9 % injection 3 mL (3 mLs Intravenous Given 07/06/18 0401)  iohexol (OMNIPAQUE) 300 MG/ML solution 30 mL (30 mLs Oral Contrast Given 07/06/18 0102)  iohexol (OMNIPAQUE) 300 MG/ML solution 100 mL (80 mLs Intravenous Contrast Given 07/06/18 0253)    Mobility walks Low fall risk   Focused Assessments NA   R Recommendations: See Admitting Provider Note  Report given to:   Additional Notes: NA

## 2018-07-06 NOTE — Consult Note (Addendum)
[facility] Consult: 1:22 PM 07/06/2018  LOS: 0 days    Referring Provider: Dr Erlinda Hong  Primary Care Physician:  Darden Amber, PA in [place] Primary Gastroenterologist:  Althia Forts.      Reason for Consultation:  Pancreatitis.     HPI: Annette Alvarado is a 53 y.o. female.  Born in [place].  Came to live in the [place] in 1992.  Hx anal fissure.  S/p hemorrhoidectomy versus anal fissure repair.  Previous colonoscopy in [facility] 02/2012, sounds as if she recently got a letter saying it was time to have another one, but care everywhere states the next colonoscopy is due 2024.    2 weeks ago she had similar experience with upper abdominal/epigastric pain that lasted less than a day, a/w nausea but no vomiting.  Once it resolved she felt well, was eating fine.  Symptoms recurred starting the evening 6/23 and again the next morning.  No radiation, no triggers.  At urgent care 6/24 she was prescribed famotidine.  Urgent care called her back later that day advising her to come to the ED because she had elevated lipase.  The pain had resolved by the time she returned to the emergency department. There has been no change in BM's.  Occasionally sees small amount blood PR a/w constipation and straining at BM.        Lipase >6000 ... 61 over 48 hours T bili normal.  Alk phos 688 >> 135.  AST/ALT 135/304.   Normal calcium and renal fx.   Trigs 49.    Ultrasound: Cholelithiasis.  CBD 3 mm.   CTAP with contrast: Diffuse pancreatitis, no fluid collections, no ductal dilatation.   No ETOH at home.  No new meds.   In fact she does not take any meds on a regular basis. Patient works as a Scientist, forensic. Family history negative for pancreatitis, gallbladder disease.  Her father is alive, has hypertension.  Her mother had breast  cancer, she passed away several years ago.    Past Medical History:  Diagnosis Date  . Cystitis 03/2017    Past Surgical History:  Procedure Laterality Date  . COLONOSCOPY  02/2012   Performed in [place], records unavailable.  Repeat colonoscopy due 02/2017  . HEMORROIDECTOMY      Prior to Admission medications   Medication Sig Start Date End Date Taking? Authorizing Provider  HYDROcodone-acetaminophen (NORCO/VICODIN) 5-325 MG tablet Take one by mouth at bedtime as needed for pain.  May repeat in 4 to 6 hours PRN 07/04/18  Yes Kandra Nicolas, MD  omeprazole (PRILOSEC) 40 MG capsule Take one cap by mouth once daily, 20 minutes before your evening meal 07/04/18  Yes Kandra Nicolas, MD    Scheduled Meds: . enoxaparin (LOVENOX) injection  40 mg Subcutaneous Daily  . sodium chloride (PF)       Infusions: . sodium chloride 100 mL/hr at 07/06/18 0523   PRN Meds: HYDROmorphone (DILAUDID) injection, ondansetron (ZOFRAN) IV   Allergies as of 07/05/2018  . (No Known Allergies)    Family History  Problem  Relation Age of Onset  . Breast cancer Mother 12    Social History   Socioeconomic History  . Marital status: Married    Spouse name: Not on file  . Number of children: Not on file  . Years of education: Not on file  . Highest education level: Not on file  Occupational History  . Not on file  Social Needs  . Financial resource strain: Not on file  . Food insecurity    Worry: Not on file    Inability: Not on file  . Transportation needs    Medical: Not on file    Non-medical: Not on file  Tobacco Use  . Smoking status: Never Smoker  . Smokeless tobacco: Never Used  Substance and Sexual Activity  . Alcohol use: Never    Frequency: Never  . Drug use: Never  . Sexual activity: Not on file  Lifestyle  . Physical activity    Days per week: Not on file    Minutes per session: Not on file  . Stress: Not on file  Relationships  . Social Herbalist  on phone: Not on file    Gets together: Not on file    Attends religious service: Not on file    Active member of club or organization: Not on file    Attends meetings of clubs or organizations: Not on file    Relationship status: Not on file  . Intimate partner violence    Fear of current or ex partner: Not on file    Emotionally abused: Not on file    Physically abused: Not on file    Forced sexual activity: Not on file  Other Topics Concern  . Not on file  Social History Narrative  . Not on file    REVIEW OF SYSTEMS: Constitutional: No weakness, no fatigue. ENT:  No nose bleeds Pulm: No shortness of breath, no cough.  When she was having the pain, taking a deep breath caused intensification of the abdominal pain CV:  No palpitations, no LE edema.  No chest pain. GU:  No hematuria, no frequency.  Dark-colored urine GYN: Has regular mammograms due to her mother's family history of breast cancer. GI: Per HPI.  Normally patient has no reflux symptoms, nausea, anorexia, dysphagia.  The only GI issue is occasional minor rectal bleeding associated with constipation. Heme: Denies excessive or unusual bleeding/bruising. Transfusions: None. Neuro:  No headaches, no peripheral tingling or numbness Derm:  No itching, no rash or sores.  Endocrine:  No sweats or chills.  No polyuria or dysuria Immunization: Did not inquire as to vaccination history.  Travel:  None beyond local counties in last few months.    PHYSICAL EXAM: Vital signs in last 24 hours: Vitals:   07/06/18 0430 07/06/18 0458  BP: 136/87 (!) 127/95  Pulse: 77 72  Resp:  20  Temp:  98.4 F (36.9 C)  SpO2: 97% 99%   Wt Readings from Last 3 Encounters:  07/05/18 53.5 kg  07/04/18 53.5 kg  02/28/18 54.4 kg    General: Pleasant, looks well.  Comfortable, alert.  Despite language barrier she is a good historian. Head: Facial asymmetry or swelling.  No signs of head trauma. Eyes: No scleral icterus.  No conjunctival  pallor. Ears: Not hard of hearing Nose: No congestion, no discharge. Mouth: Excellent dentition.  Tongue midline.  Mucosa pink, moist, clear.  Smile symmetric. Neck: No JVD, no thyromegaly, no bruits, no adenopathy. Lungs: Nonlabored breathing.  No cough.  Lungs clear bilaterally Heart: RRR.  No MRG.  S1, S2 present Abdomen: Soft.  Not tender.  Not distended.  No HSM, masses, bruits, hernias..   Rectal: Deferred Musc/Skeltl: No joint contracture deformities, swelling or gross changes Extremities: No CCE. Neurologic: Alert.  Oriented x3.  Moves all 4 limbs easily, no weakness.  No tremors. Skin: Jaundice, no rash, no telangiectasia Tattoos: Cosmetic type on the eyebrows and eyelids. Nodes: No cervical or inguinal adenopathy. Psych: Calm, pleasant, cooperative, fluid speech.  Intake/Output from previous day: 06/25 0701 - 06/26 0700 In: 3 [I.V.:3] Out: -  Intake/Output this shift: Total I/O In: 0  Out: 400 [Urine:400]  LAB RESULTS: Recent Labs    07/06/18 0010  WBC 9.1  HGB 12.4  HCT 38.1  PLT 263   BMET Lab Results  Component Value Date   NA 142 07/06/2018   NA 138 07/04/2018   K 3.5 07/06/2018   K 4.5 07/04/2018   CL 108 07/06/2018   CL 103 07/04/2018   CO2 23 07/06/2018   CO2 27 07/04/2018   GLUCOSE 101 (H) 07/06/2018   GLUCOSE 125 (H) 07/04/2018   BUN 11 07/06/2018   BUN 18 07/04/2018   CREATININE 0.47 07/06/2018   CREATININE 0.47 (L) 07/04/2018   CALCIUM 9.4 07/06/2018   CALCIUM 9.6 07/04/2018   LFT Recent Labs    07/04/18 0953 07/06/18 0010  PROT 7.6 7.8  ALBUMIN  --  4.0  AST 688* 135*  ALT 442* 304*  ALKPHOS  --  184*  BILITOT 0.8 0.4   Hepatitis Panel No results for input(s): HEPBSAG, HCVAB, HEPAIGM, HEPBIGM in the last 72 hours. Lipase     Component Value Date/Time   LIPASE 61 (H) 07/06/2018 0010    Drugs of Abuse  No results found for: LABOPIA, COCAINSCRNUR, LABBENZ, AMPHETMU, THCU, LABBARB   RADIOLOGY STUDIES: Ct Abdomen Pelvis W  Contrast  Result Date: 07/06/2018 CLINICAL DATA:  53 year old female with epigastric pain and elevated lipase. EXAM: CT ABDOMEN AND PELVIS WITH CONTRAST TECHNIQUE: Multidetector CT imaging of the abdomen and pelvis was performed using the standard protocol following bolus administration of intravenous contrast. CONTRAST:  77m OMNIPAQUE IOHEXOL 300 MG/ML  SOLN COMPARISON:  None. FINDINGS: Evaluation of this exam is limited due to respiratory motion artifact. Lower chest: Minimal bibasilar dependent atelectatic changes. The visualized lung bases are otherwise clear. No intra-abdominal free air or free fluid. Hepatobiliary: The liver is unremarkable. No intrahepatic biliary ductal dilatation. The gallbladder is unremarkable. Pancreas: There diffuse inflammatory changes of the pancreas consistent with pancreatitis. No fluid collection/abscess or pseudocyst. Spleen: Normal in size without focal abnormality. Adrenals/Urinary Tract: Adrenal glands are unremarkable. Kidneys are normal, without renal calculi, focal lesion, or hydronephrosis. Bladder is unremarkable. Stomach/Bowel: There is no bowel obstruction or active inflammation. Normal appendix. Moderate colonic stool burden. Vascular/Lymphatic: No significant vascular findings are present. No enlarged abdominal or pelvic lymph nodes. Reproductive: Uterus and bilateral adnexa are unremarkable. Other: None Musculoskeletal: No acute or significant osseous findings. IMPRESSION: Acute pancreatitis.  No abscess or pseudocyst. Electronically Signed   By: AAnner CreteM.D.   On: 07/06/2018 03:11   [place]dom[place] Limited Ruq  Result Date: 07/06/2018 CLINICAL DATA:  Pancreatitis EXAM: ULTRASOUND ABDOMEN LIMITED RIGHT UPPER QUADRANT COMPARISON:  None. FINDINGS: Gallbladder: Multiple small stones layer in the dependent aspect of the gallbladder measured up to 5.7 mm diameter. No gallbladder wall thickening or pericholecystic fluid. Sonographer describes no sonographic  Murphy's sign. Common bile duct: Diameter: 3  mm, unremarkable Liver: No focal lesion identified. Within normal limits in parenchymal echogenicity. Portal vein is patent on color Doppler imaging with normal direction of blood flow towards the liver. IMPRESSION: Cholelithiasis Electronically Signed   By: Lucrezia Europe M.D.   On: 07/06/2018 10:35     IMPRESSION:   *    Acute pancreatitis.  ? Biliary etiology.  Triglycerides not elevated.  No ETOH Biliary ducts normal.  Lipase and LFTs improved.   Clinically the pancreatitis is mild despite the very high initial lipase.   Her pain has resolved.  She is hungry.    PLAN:     *   Would obtain surgical consult.  Suspect she can go straight to laparoscopic cholecystectomy.  Given unremarkable bile duct, neither MRCP or ERCP  indicated  *    Ordered clear liquid diet, advance to regular diet if clears tolerated.  I don't see need for aggressive IVF, leave at current NaCl @ 100/hour.     Azucena Freed  07/06/2018, 1:22 PM Phone [phone]     Attending physician's note   I have taken an interval history, reviewed the chart and examined the patient. I agree with the Advanced Practitioner's note, impression and recommendations.   CT reviewed. [place] report reviewed.  Gallstone pancreatitis.  It appears that she has passed the CBD stone.  LFTs trending down.  The abdominal pain has completely resolved.  Plan: -Advance diet. -Recommend surgical consult for lap chole with IOC.  -Hold off on MRCP. -Will sign off for now. ERCP only if IOC is positive.  Carmell Austria, MD [facility] [phone].

## 2018-07-07 ENCOUNTER — Observation Stay (HOSPITAL_COMMUNITY): Payer: BLUE CROSS/BLUE SHIELD | Admitting: Anesthesiology

## 2018-07-07 ENCOUNTER — Observation Stay (HOSPITAL_COMMUNITY): Payer: BLUE CROSS/BLUE SHIELD

## 2018-07-07 ENCOUNTER — Encounter (HOSPITAL_COMMUNITY)
Admission: EM | Disposition: A | Discharge status: Home / Self Care | Payer: Self-pay | Source: Home / Self Care | Attending: Internal Medicine

## 2018-07-07 DIAGNOSIS — K8020 Calculus of gallbladder without cholecystitis without obstruction: Secondary | ICD-10-CM

## 2018-07-07 DIAGNOSIS — E876 Hypokalemia: Secondary | ICD-10-CM

## 2018-07-07 DIAGNOSIS — K859 Acute pancreatitis without necrosis or infection, unspecified: Secondary | ICD-10-CM

## 2018-07-07 DIAGNOSIS — R945 Abnormal results of liver function studies: Secondary | ICD-10-CM | POA: Diagnosis not present

## 2018-07-07 HISTORY — PX: CHOLECYSTECTOMY: SHX55

## 2018-07-07 LAB — COMPREHENSIVE METABOLIC PANEL
ALT: 184 U/L — ABNORMAL HIGH (ref 0–44)
AST: 54 U/L — ABNORMAL HIGH (ref 15–41)
Albumin: 3.4 g/dL — ABNORMAL LOW (ref 3.5–5.0)
Alkaline Phosphatase: 139 U/L — ABNORMAL HIGH (ref 38–126)
Anion gap: 9 (ref 5–15)
BUN: 8 mg/dL (ref 6–20)
CO2: 23 mmol/L (ref 22–32)
Calcium: 8.8 mg/dL — ABNORMAL LOW (ref 8.9–10.3)
Chloride: 109 mmol/L (ref 98–111)
Creatinine, Ser: 0.47 mg/dL (ref 0.44–1.00)
GFR calc Af Amer: >60 mL/min (ref >60)
GFR calc non Af Amer: >60 mL/min (ref >60)
Glucose, Bld: 89 mg/dL (ref 70–99)
Potassium: 3.4 mmol/L — ABNORMAL LOW (ref 3.5–5.1)
Sodium: 141 mmol/L (ref 135–145)
Total Bilirubin: 0.4 mg/dL (ref 0.3–1.2)
Total Protein: 6.7 g/dL (ref 6.5–8.1)

## 2018-07-07 LAB — CBC
HCT: 35.3 % — ABNORMAL LOW (ref 36.0–46.0)
Hemoglobin: 11.5 g/dL — ABNORMAL LOW (ref 12.0–15.0)
MCH: 29.2 pg (ref 26.0–34.0)
MCHC: 32.6 g/dL (ref 30.0–36.0)
MCV: 89.6 fL (ref 80.0–100.0)
Platelets: 240 10*3/uL (ref 150–400)
RBC: 3.94 MIL/uL (ref 3.87–5.11)
RDW: 13.2 % (ref 11.5–15.5)
WBC: 6.8 10*3/uL (ref 4.0–10.5)
nRBC: 0 % (ref 0.0–0.2)

## 2018-07-07 LAB — HEPATITIS PANEL, ACUTE
HCV Ab: <0.1 s/co ratio (ref 0.0–0.9)
Hep A IgM: NEGATIVE
Hep B C IgM: NEGATIVE
Hepatitis B Surface Ag: NEGATIVE

## 2018-07-07 SURGERY — LAPAROSCOPIC CHOLECYSTECTOMY
Anesthesia: General | Site: Abdomen

## 2018-07-07 MED ORDER — ROCURONIUM BROMIDE 10 MG/ML (PF) SYRINGE
PREFILLED_SYRINGE | INTRAVENOUS | Status: AC
  Filled 2018-07-07: qty 10

## 2018-07-07 MED ORDER — EPHEDRINE 5 MG/ML INJ
INTRAVENOUS | Status: AC
  Filled 2018-07-07: qty 10

## 2018-07-07 MED ORDER — PROMETHAZINE HCL 25 MG/ML IJ SOLN: 6.2500 mg | INTRAMUSCULAR | Status: DC | PRN

## 2018-07-07 MED ORDER — FENTANYL CITRATE (PF) 100 MCG/2ML IJ SOLN
INTRAMUSCULAR | Status: DC | PRN
  Administered 2018-07-07 (×2): 50 ug via INTRAVENOUS

## 2018-07-07 MED ORDER — PROPOFOL 10 MG/ML IV BOLUS
INTRAVENOUS | Status: AC
  Filled 2018-07-07: qty 20

## 2018-07-07 MED ORDER — MIDAZOLAM HCL 2 MG/2ML IJ SOLN
INTRAMUSCULAR | Status: AC
  Filled 2018-07-07: qty 2

## 2018-07-07 MED ORDER — PHENYLEPHRINE 40 MCG/ML (10ML) SYRINGE FOR IV PUSH (FOR BLOOD PRESSURE SUPPORT)
PREFILLED_SYRINGE | INTRAVENOUS | Status: AC
  Filled 2018-07-07: qty 10

## 2018-07-07 MED ORDER — PROPOFOL 10 MG/ML IV BOLUS
INTRAVENOUS | Status: DC | PRN
  Administered 2018-07-07: 120 mg via INTRAVENOUS

## 2018-07-07 MED ORDER — LABETALOL HCL 5 MG/ML IV SOLN
INTRAVENOUS | Status: AC
  Filled 2018-07-07: qty 4

## 2018-07-07 MED ORDER — LIDOCAINE 2% (20 MG/ML) 5 ML SYRINGE
INTRAMUSCULAR | Status: AC
  Filled 2018-07-07: qty 5

## 2018-07-07 MED ORDER — 0.9 % SODIUM CHLORIDE (POUR BTL) OPTIME
TOPICAL | Status: DC | PRN
  Administered 2018-07-07: 1000 mL

## 2018-07-07 MED ORDER — SCOPOLAMINE 1 MG/3DAYS TD PT72
MEDICATED_PATCH | TRANSDERMAL | Status: AC
  Filled 2018-07-07: qty 1

## 2018-07-07 MED ORDER — BUPIVACAINE-EPINEPHRINE (PF) 0.25% -1:200000 IJ SOLN
INTRAMUSCULAR | Status: AC
  Filled 2018-07-07: qty 30

## 2018-07-07 MED ORDER — POTASSIUM CHLORIDE CRYS ER 20 MEQ PO TBCR
40.0000 meq | EXTENDED_RELEASE_TABLET | Freq: Once | ORAL | Status: AC
  Administered 2018-07-07: 40 meq via ORAL
  Filled 2018-07-07: qty 2

## 2018-07-07 MED ORDER — FENTANYL CITRATE (PF) 100 MCG/2ML IJ SOLN
INTRAMUSCULAR | Status: AC
  Filled 2018-07-07: qty 2

## 2018-07-07 MED ORDER — LACTATED RINGERS IV SOLN
INTRAVENOUS | Status: DC | PRN
  Administered 2018-07-07: 07:00:00 via INTRAVENOUS

## 2018-07-07 MED ORDER — SUGAMMADEX SODIUM 200 MG/2ML IV SOLN
INTRAVENOUS | Status: AC
  Filled 2018-07-07: qty 2

## 2018-07-07 MED ORDER — SCOPOLAMINE 1 MG/3DAYS TD PT72
MEDICATED_PATCH | TRANSDERMAL | Status: DC | PRN
  Administered 2018-07-07: 1 via TRANSDERMAL

## 2018-07-07 MED ORDER — ONDANSETRON HCL 4 MG/2ML IJ SOLN
INTRAMUSCULAR | Status: AC
  Filled 2018-07-07: qty 2

## 2018-07-07 MED ORDER — HYDROCODONE-ACETAMINOPHEN 5-325 MG PO TABS
1.0000 | ORAL_TABLET | ORAL | Status: DC | PRN
  Administered 2018-07-07: 1 via ORAL
  Administered 2018-07-08 (×2): 2 via ORAL
  Filled 2018-07-07: qty 2
  Filled 2018-07-07: qty 1
  Filled 2018-07-07: qty 2

## 2018-07-07 MED ORDER — ROCURONIUM BROMIDE 10 MG/ML (PF) SYRINGE
PREFILLED_SYRINGE | INTRAVENOUS | Status: DC | PRN
  Administered 2018-07-07: 30 mg via INTRAVENOUS
  Administered 2018-07-07: 5 mg via INTRAVENOUS

## 2018-07-07 MED ORDER — SUCCINYLCHOLINE CHLORIDE 200 MG/10ML IV SOSY
PREFILLED_SYRINGE | INTRAVENOUS | Status: DC | PRN
  Administered 2018-07-07: 100 mg via INTRAVENOUS

## 2018-07-07 MED ORDER — BUPIVACAINE-EPINEPHRINE 0.25% -1:200000 IJ SOLN
INTRAMUSCULAR | Status: DC | PRN
  Administered 2018-07-07: 25 mL

## 2018-07-07 MED ORDER — LIDOCAINE 2% (20 MG/ML) 5 ML SYRINGE
INTRAMUSCULAR | Status: DC | PRN
  Administered 2018-07-07: 60 mg via INTRAVENOUS

## 2018-07-07 MED ORDER — MORPHINE SULFATE (PF) 4 MG/ML IV SOLN: 1.0000 mg | INTRAVENOUS | Status: DC | PRN

## 2018-07-07 MED ORDER — MEPERIDINE HCL 50 MG/ML IJ SOLN: 6.2500 mg | INTRAMUSCULAR | Status: DC | PRN

## 2018-07-07 MED ORDER — SUGAMMADEX SODIUM 200 MG/2ML IV SOLN
INTRAVENOUS | Status: DC | PRN
  Administered 2018-07-07: 125 mg via INTRAVENOUS

## 2018-07-07 MED ORDER — MIDAZOLAM HCL 2 MG/2ML IJ SOLN: 0.5000 mg | Freq: Once | INTRAMUSCULAR | Status: DC | PRN

## 2018-07-07 MED ORDER — SUCCINYLCHOLINE CHLORIDE 200 MG/10ML IV SOSY
PREFILLED_SYRINGE | INTRAVENOUS | Status: AC
  Filled 2018-07-07: qty 10

## 2018-07-07 MED ORDER — HYDROMORPHONE HCL 1 MG/ML IJ SOLN: 0.2500 mg | INTRAMUSCULAR | Status: DC | PRN

## 2018-07-07 MED ORDER — LACTATED RINGERS IV SOLN
INTRAVENOUS | Status: AC | PRN
  Administered 2018-07-07: 1000 mL

## 2018-07-07 MED ORDER — SCOPOLAMINE 1 MG/3DAYS TD PT72: 1.0000 | MEDICATED_PATCH | Freq: Once | TRANSDERMAL | Status: DC

## 2018-07-07 MED ORDER — MIDAZOLAM HCL 5 MG/5ML IJ SOLN
INTRAMUSCULAR | Status: DC | PRN
  Administered 2018-07-07: 2 mg via INTRAVENOUS

## 2018-07-07 MED ORDER — IOHEXOL 300 MG/ML  SOLN
INTRAMUSCULAR | Status: DC | PRN
  Administered 2018-07-07: 50 mL

## 2018-07-07 MED ORDER — ENOXAPARIN SODIUM 40 MG/0.4ML ~~LOC~~ SOLN: 40.0000 mg | Freq: Every day | SUBCUTANEOUS | Status: DC

## 2018-07-07 SURGICAL SUPPLY — 30 items
APPLIER CLIP 5 13 M/L LIGAMAX5 (MISCELLANEOUS) ×3
CATH REDDICK CHOLANGI 4FR 50CM (CATHETERS) ×3 IMPLANT
CLIP APPLIE 5 13 M/L LIGAMAX5 (MISCELLANEOUS) ×1 IMPLANT
COVER MAYO STAND STRL (DRAPES) ×3 IMPLANT
COVER WAND RF STERILE (DRAPES) IMPLANT
DECANTER SPIKE VIAL GLASS SM (MISCELLANEOUS) ×3 IMPLANT
DERMABOND ADVANCED (GAUZE/BANDAGES/DRESSINGS) ×2
DERMABOND ADVANCED .7 DNX12 (GAUZE/BANDAGES/DRESSINGS) ×1 IMPLANT
DRAPE C-ARM 42X120 X-RAY (DRAPES) ×3 IMPLANT
ELECT REM PT RETURN 15FT ADLT (MISCELLANEOUS) ×3 IMPLANT
GLOVE BIO SURGEON STRL SZ7.5 (GLOVE) ×6 IMPLANT
GOWN STRL REUS W/ TWL XL LVL3 (GOWN DISPOSABLE) ×1 IMPLANT
GOWN STRL REUS W/TWL LRG LVL3 (GOWN DISPOSABLE) ×3 IMPLANT
GOWN STRL REUS W/TWL XL LVL3 (GOWN DISPOSABLE) ×5 IMPLANT
HEMOSTAT SURGICEL 4X8 (HEMOSTASIS) ×3 IMPLANT
IV CATH 14GX2 1/4 (CATHETERS) ×3 IMPLANT
KIT BASIN OR (CUSTOM PROCEDURE TRAY) ×3 IMPLANT
KIT TURNOVER KIT A (KITS) IMPLANT
PAD POSITIONING PINK XL (MISCELLANEOUS) IMPLANT
POUCH SPECIMEN RETRIEVAL 10MM (ENDOMECHANICALS) ×3 IMPLANT
PROTECTOR NERVE ULNAR (MISCELLANEOUS) IMPLANT
SET IRRIG TUBING LAPAROSCOPIC (IRRIGATION / IRRIGATOR) ×3 IMPLANT
SET TUBE SMOKE EVAC HIGH FLOW (TUBING) IMPLANT
SLEEVE XCEL OPT CAN 5 100 (ENDOMECHANICALS) ×6 IMPLANT
SUT MNCRL AB 4-0 PS2 18 (SUTURE) ×3 IMPLANT
TAPE CLOTH 4X10 WHT NS (GAUZE/BANDAGES/DRESSINGS) IMPLANT
TOWEL OR 17X26 10 PK STRL BLUE (TOWEL DISPOSABLE) ×3 IMPLANT
TRAY LAPAROSCOPIC (CUSTOM PROCEDURE TRAY) ×3 IMPLANT
TROCAR BLADELESS OPT 5 100 (ENDOMECHANICALS) ×3 IMPLANT
TROCAR XCEL BLUNT TIP 100MML (ENDOMECHANICALS) ×3 IMPLANT

## 2018-07-07 NOTE — Anesthesia Postprocedure Evaluation (Signed)
Anesthesia Post Note  Patient: Annette Alvarado  Procedure(s) Performed: LAPAROSCOPIC CHOLECYSTECTOMY withINTRAOPERATIVE CHOLANGIOGRAM (N/A Abdomen)     Patient location during evaluation: PACU Anesthesia Type: General Level of consciousness: awake and alert, patient cooperative and oriented Pain management: pain level controlled Vital Signs Assessment: post-procedure vital signs reviewed and stable Respiratory status: spontaneous breathing, nonlabored ventilation and respiratory function stable Cardiovascular status: blood pressure returned to baseline and stable Postop Assessment: no apparent nausea or vomiting Anesthetic complications: no    Last Vitals:  Vitals:   07/07/18 0900 07/07/18 0915  BP: 140/77 (!) 144/80  Pulse: (!) 58 (!) 52  Resp: 18 15  Temp:  36.5 C  SpO2: 100% 99%    Last Pain:  Vitals:   07/07/18 0915  TempSrc:   PainSc: 0-No pain                 Avelyn Touch,E. Arnetia Bronk

## 2018-07-07 NOTE — Op Note (Signed)
07/05/2018 - 07/07/2018  8:23 AM  PATIENT:  Annette Alvarado  53 y.o. female  PRE-OPERATIVE DIAGNOSIS:  GALLSTONES, PANCREATITIS  POST-OPERATIVE DIAGNOSIS:  gallstones,pancreatitis  PROCEDURE:  Procedure(s): LAPAROSCOPIC CHOLECYSTECTOMY withINTRAOPERATIVE CHOLANGIOGRAM (N/A)  SURGEON:  Surgeon(s) and Role:    * Jovita Kussmaul, MD - Primary  PHYSICIAN ASSISTANT:   ASSISTANTS: none   ANESTHESIA:   local and general  EBL:  minimal   BLOOD ADMINISTERED:none  DRAINS: none   LOCAL MEDICATIONS USED:  MARCAINE     SPECIMEN:  Source of Specimen:  gallbladder  DISPOSITION OF SPECIMEN:  PATHOLOGY  COUNTS:  YES  TOURNIQUET:  * No tourniquets in log *  DICTATION: .Dragon Dictation     Procedure: After informed consent was obtained the patient was brought to the operating room and placed in the supine position on the operating room table. After adequate induction of general anesthesia the patient's abdomen was prepped with ChloraPrep allowed to dry and draped in usual sterile manner. An appropriate timeout was performed. The area below the umbilicus was infiltrated with quarter percent  Marcaine. A small incision was made with a 15 blade knife. The incision was carried down through the subcutaneous tissue bluntly with a hemostat and [employer] retractors. The linea alba was identified. The linea alba was incised with a 15 blade knife and each side was grasped with Coker clamps. The preperitoneal space was then probed with a hemostat until the peritoneum was opened and access was gained to the abdominal cavity. A 0 Vicryl pursestring stitch was placed in the fascia surrounding the opening. A Hassan cannula was then placed through the opening and anchored in place with the previously placed Vicryl purse string stitch. The abdomen was insufflated with carbon dioxide without difficulty. A laparoscope was inserted through the [facility] cannula in the right upper quadrant was inspected. Next the epigastric  region was infiltrated with % Marcaine. A small incision was made with a 15 blade knife. A 5 mm port was placed bluntly through this incision into the abdominal cavity under direct vision. Next 2 sites were chosen laterally on the right side of the abdomen for placement of 5 mm ports. Each of these areas was infiltrated with quarter percent Marcaine. Small stab incisions were made with a 15 blade knife. 5 mm ports were then placed bluntly through these incisions into the abdominal cavity under direct vision without difficulty. A blunt grasper was placed through the lateralmost 5 mm port and used to grasp the dome of the gallbladder and elevated anteriorly and superiorly. Another blunt grasper was placed through the other 5 mm port and used to retract the body and neck of the gallbladder. A dissector was placed through the epigastric port and using the electrocautery the peritoneal reflection at the gallbladder neck was opened. Blunt dissection was then carried out in this area until the gallbladder neck-cystic duct junction was readily identified and a good window was created. A single clip was placed on the gallbladder neck. A small  ductotomy was made just below the clip with laparoscopic scissors. A 14-gauge Angiocath was then placed through the anterior abdominal wall under direct vision. A Reddick cholangiogram catheter was then placed through the Angiocath and flushed. The catheter was then placed in the cystic duct and anchored in place with a clip. A cholangiogram was obtained that showed no filling defects good emptying into the duodenum an adequate length on the cystic duct. The anchoring clip and catheters were then removed from the patient. 3 clips  were placed proximally on the cystic duct and the duct was divided between the 2 sets of clips. Posterior to this the cystic artery was identified and again dissected bluntly in a circumferential manner until a good window  was created. 2 clips were placed  proximally and one distally on the artery and the artery was divided between the 2 sets of clips. Next a laparoscopic hook cautery device was used to separate the gallbladder from the liver bed. Prior to completely detaching the gallbladder from the liver bed the liver bed was inspected and several small bleeding points were coagulated with the electrocautery until the area was completely hemostatic. The gallbladder was then detached the rest of it from the liver bed without difficulty. A laparoscopic bag was inserted through the hassan port. The laparoscope was moved to the epigastric port. The gallbladder was placed within the bag and the bag was sealed.  The bag with the gallbladder was then removed with the [facility] cann[facility] through the infraumbilical port without difficulty. The fascial defect was then closed with the previously placed Vicryl pursestring stitch as well as with another figure-of-eight 0 Vicryl stitch. The liver bed was inspected again and found to be hemostatic. The abdomen was irrigated with copious amounts of saline until the effluent was clear. The ports were then removed under direct vision without difficulty and were found to be hemostatic. The gas was allowed to escape. The skin incisions were all closed with interrupted 4-0 Monocryl subcuticular stitches. Dermabond dressings were applied. The patient tolerated the procedure well. At the end of the case all needle sponge and instrument counts were correct. The patient was then awakened and taken to recovery in stable condition  PLAN OF CARE: Admit for overnight observation  PATIENT DISPOSITION:  PACU - hemodynamically stable.   Delay start of Pharmacological VTE agent (>24hrs) due to surgical blood loss or risk of bleeding: no

## 2018-07-07 NOTE — Transfer of Care (Signed)
Immediate Anesthesia Transfer of Care Note  Patient: Annette Alvarado  Procedure(s) Performed: LAPAROSCOPIC CHOLECYSTECTOMY withINTRAOPERATIVE CHOLANGIOGRAM (N/A Abdomen)  Patient Location: PACU  Anesthesia Type:General  Level of Consciousness: awake, alert  and oriented  Airway & Oxygen Therapy: Patient Spontanous Breathing and Patient connected to nasal cannula oxygen  Post-op Assessment: Report given to RN and Post -op Vital signs reviewed and stable  Post vital signs: Reviewed and stable  Last Vitals:  Vitals Value Taken Time  BP    Temp    Pulse 68 07/07/18 0835  Resp 16 07/07/18 0835  SpO2 100 % 07/07/18 0835  Vitals shown include unvalidated device data.  Last Pain:  Vitals:   07/07/18 0537  TempSrc: Oral  PainSc:          Complications: No apparent anesthesia complications

## 2018-07-07 NOTE — Progress Notes (Signed)
PROGRESS NOTE  Vanessa BarbaraHuong Yamaguchi WUJ:[id number] DOB: 11/08/1965 DOA: 07/05/2018 PCP: Christ KickErvin, Alison M, PA  HPI/Recap of past 24 hours:  Patient is seen after returned from the OR, no pain, no n/v   No fever, no leukocytosis  Assessment/Plan: Principal Problem:   Abnormal liver function Active Problems:   Acute pancreatitis  Gallstone pancreatitis: -pain resolved, lipase/lft coming down -ab us no CBD dilatation -GI consulted who recommend to proceed with surgery consult -general surgery consulted, s/p lap chole on 6/27   Hypokalemia Replace k  SARS COV2 screening negative  Code Status: full  Family Communication: patient   Disposition Plan: home tomorrow with  General surgery clearance   Consultants:  GI  General surgery  Procedures:  Lap chole on 6/27  Antibiotics:  perioperative    Objective: BP 135/80 (BP Location: Right Arm)   Pulse (!) 53   Temp 97.8 F (36.6 C) (Oral)   Resp 16   Ht 5\' 1"  (1.549 m)   Wt 53.5 kg   SpO2 99%   BMI 22.30 kg/m   Intake/Output Summary (Last 24 hours) at 07/07/2018 [id number] Last data filed at 07/07/2018 0911 Gross per 24 hour  Intake 3758.22 ml  Output 2000 ml  Net 1758.22 ml   Filed Weights   07/05/18 2253  Weight: 53.5 kg    Exam: Patient is examined daily including today on 07/07/2018, exams remain the same as of yesterday except that has changed    General:  NAD  Cardiovascular: RRR  Respiratory: CTABL  Abdomen: post lap chole, incision site clean, Soft/ND/NT, positive BS  Musculoskeletal: No Edema  Neuro: alert, oriented   Data Reviewed: Basic Metabolic Panel: Recent Labs  Lab 07/04/18 0953 07/06/18 0010 07/07/18 0322  NA 138 142 141  K 4.5 3.5 3.4*  CL 103 108 109  CO2 27 23 23   GLUCOSE 125* 101* 89  BUN 18 11 8   CREATININE 0.47* 0.47 0.47  CALCIUM 9.6 9.4 8.8*   Liver Function Tests: Recent Labs  Lab 07/04/18 0953 07/06/18 0010 07/07/18 0322  AST 688* 135* 54*  ALT 442* 304* 184*   ALKPHOS  --  184* 139*  BILITOT 0.8 0.4 0.4  PROT 7.6 7.8 6.7  ALBUMIN  --  4.0 3.4*   Recent Labs  Lab 07/04/18 0953 07/06/18 0010  LIPASE >6,000* 61*  AMYLASE 2,590*  --    No results for input(s): AMMONIA in the last 168 hours. CBC: Recent Labs  Lab 07/06/18 0010 07/07/18 0322  WBC 9.1 6.8  NEUTROABS 6.3  --   HGB 12.4 11.5*  HCT 38.1 35.3*  MCV 88.8 89.6  PLT 263 240   Cardiac Enzymes:   No results for input(s): CKTOTAL, CKMB, CKMBINDEX, TROPONINI in the last 168 hours. BNP (last 3 results) No results for input(s): BNP in the last 8760 hours.  ProBNP (last 3 results) No results for input(s): PROBNP in the last 8760 hours.  CBG: No results for input(s): GLUCAP in the last 168 hours.  Recent Results (from the past 240 hour(s))  SARS Coronavirus 2 (CEPHEID - Performed [facility]), Hosp Order     Status: None   Collection Time: 07/06/18  4:00 AM   Specimen: Nasopharyngeal Swab  Result Value Ref Range Status   SARS Coronavirus 2 NEGATIVE NEGATIVE Final    Comment: (NOTE) If result is NEGATIVE SARS-CoV-2 target nucleic acids are NOT DETECTED. The SARS-CoV-2 RNA is generally detectable in upper and lower  respiratory specimens during the  acute phase of infection. The lowest  concentration of SARS-CoV-2 viral copies this assay can detect is 250  copies / mL. A negative result does not preclude SARS-CoV-2 infection  and should not be used as the sole basis for treatment or other  patient management decisions.  A negative result may occur with  improper specimen collection / handling, submission of specimen other  than nasopharyngeal swab, presence of viral mutation(s) within the  areas targeted by this assay, and inadequate number of viral copies  (<250 copies / mL). A negative result must be combined with clinical  observations, patient history, and epidemiological information. If result is POSITIVE SARS-CoV-2 target nucleic acids are DETECTED.  The SARS-CoV-2 RNA is generally detectable in upper and lower  respiratory specimens dur ing the acute phase of infection.  Positive  results are indicative of active infection with SARS-CoV-2.  Clinical  correlation with patient history and other diagnostic information is  necessary to determine patient infection status.  Positive results Saran  not rule out bacterial infection or co-infection with other viruses. If result is PRESUMPTIVE POSTIVE SARS-CoV-2 nucleic acids MAY BE PRESENT.   A presumptive positive result was obtained on the submitted specimen  and confirmed on repeat testing.  While 2019 novel coronavirus  (SARS-CoV-2) nucleic acids may be present in the submitted sample  additional confirmatory testing may be necessary for epidemiological  and / or clinical management purposes  to differentiate between  SARS-CoV-2 and other Sarbecovirus currently known to infect humans.  If clinically indicated additional testing with an alternate test  methodology [phone]) is advised. The SARS-CoV-2 RNA is generally  detectable in upper and lower respiratory sp ecimens during the acute  phase of infection. The expected result is Negative. Fact Sheet for Patients:  StrictlyIdeas.no Fact Sheet for Healthcare Providers: BankingDealers.co.za This test is not yet approved or cleared by the [place] FDA and has been authorized for detection and/or diagnosis of SARS-CoV-2 by FDA under an Emergency Use Authorization (EUA).  This EUA will remain in effect (meaning this test can be used) for the duration of the COVID-19 declaration under Section 564(b)(1) of the Act, 21 U.S.C. section 360bbb-3(b)(1), unless the authorization is terminated or revoked sooner. Performed at [facility], [place] [address], [place], [place] [address]   Surgical PCR screen     Status: None   Collection Time: 07/06/18  8:05 PM   Specimen: Nasal  Mucosa; Nasal Swab  Result Value Ref Range Status   MRSA, PCR NEGATIVE NEGATIVE Final   Staphylococcus aureus NEGATIVE NEGATIVE Final    Comment: (NOTE) The Xpert SA Assay (FDA approved for NASAL specimens in patients 51 years of age and older), is one component of a comprehensive surveillance program. It is not intended to diagnose infection nor to guide or monitor treatment. Performed at [facility], [place] [address], [place], [place] [address]      Studies: Dg Cholangiogram Operative  Result Date: 07/07/2018 CLINICAL DATA:  53 year old female with a history of cholelithiasis EXAM: INTRAOPERATIVE CHOLANGIOGRAM TECHNIQUE: Cholangiographic images from the C-arm fluoroscopic device were submitted for interpretation post-operatively. Please see the procedural report for the amount of contrast and the fluoroscopy time utilized. COMPARISON:  None. FINDINGS: Surgical instruments project over the upper abdomen. There is cannulation of the cystic duct/gallbladder neck, with antegrade infusion of contrast. Caliber of the extrahepatic ductal system within normal limits. No definite filling defect within the extrahepatic ducts identified. Free flow of contrast across the ampulla. IMPRESSION:  Intraoperative cholangiogram demonstrates extrahepatic biliary ducts of unremarkable caliber, with no definite filling defects identified. Free flow of contrast across the ampulla. Please refer to the dictated operative report for full details of intraoperative findings and procedure Electronically Signed   By: Gilmer MorJaime  Wagner D.O.   On: 07/07/2018 08:29   [place] Abdomen Limited Ruq  Result Date: 07/06/2018 CLINICAL DATA:  Pancreatitis EXAM: ULTRASOUND ABDOMEN LIMITED RIGHT UPPER QUADRANT COMPARISON:  None. FINDINGS: Gallbladder: Multiple small stones layer in the dependent aspect of the gallbladder measured up to 5.7 mm diameter. No gallbladder wall thickening or pericholecystic fluid. Sonographer  describes no sonographic Murphy's sign. Common bile duct: Diameter: 3 mm, unremarkable Liver: No focal lesion identified. Within normal limits in parenchymal echogenicity. Portal vein is patent on color Doppler imaging with normal direction of blood flow towards the liver. IMPRESSION: Cholelithiasis Electronically Signed   By: Corlis Leak  Hassell M.D.   On: 07/06/2018 10:35    Scheduled Meds: . Chlorhexidine Gluconate Cloth  6 each Topical Once  . [START ON 07/08/2018] enoxaparin (LOVENOX) injection  40 mg Subcutaneous Daily  . potassium chloride  40 mEq Oral Once    Continuous Infusions:    Time spent: 25mins I have personally reviewed and interpreted on  07/07/2018 daily labs, imagings as discussed above under date review session and assessment and plans.  I reviewed all nursing notes, pharmacy notes, consultant notes,  vitals, pertinent old records  I have discussed plan of care as described above with RN , patient on 07/07/2018   Albertine GratesFang Emmory Solivan MD, PhD  [facility] Pager [phone]. If 7PM-7AM, please contact night-coverage at [email or web address], password [email or web address] 07/07/2018, 9:52 AM  LOS: 0 days

## 2018-07-07 NOTE — Anesthesia Procedure Notes (Signed)
Procedure Name: Intubation Date/Time: 07/07/2018 7:35 AM Performed by: Marya Lowden D, CRNA Pre-anesthesia Checklist: Patient identified, Emergency Drugs available, Suction available and Patient being monitored Patient Re-evaluated:Patient Re-evaluated prior to induction Oxygen Delivery Method: Circle system utilized Preoxygenation: Pre-oxygenation with 100% oxygen Induction Type: IV induction and Rapid sequence Laryngoscope Size: Mac and 3 Grade View: Grade I Tube type: Oral Tube size: 7.0 mm Number of attempts: 1 Airway Equipment and Method: Stylet Placement Confirmation: ETT inserted through vocal cords under direct vision,  positive ETCO2 and breath sounds checked- equal and bilateral Secured at: 20 cm Tube secured with: Tape Dental Injury: Teeth and Oropharynx as per pre-operative assessment

## 2018-07-08 ENCOUNTER — Encounter (HOSPITAL_COMMUNITY): Payer: Self-pay | Admitting: General Surgery

## 2018-07-08 DIAGNOSIS — K851 Biliary acute pancreatitis without necrosis or infection: Secondary | ICD-10-CM | POA: Diagnosis not present

## 2018-07-08 DIAGNOSIS — Z8249 Family history of ischemic heart disease and other diseases of the circulatory system: Secondary | ICD-10-CM | POA: Diagnosis not present

## 2018-07-08 DIAGNOSIS — R945 Abnormal results of liver function studies: Secondary | ICD-10-CM | POA: Diagnosis not present

## 2018-07-08 DIAGNOSIS — K8070 Calculus of gallbladder and bile duct without cholecystitis without obstruction: Secondary | ICD-10-CM | POA: Diagnosis present

## 2018-07-08 DIAGNOSIS — Z803 Family history of malignant neoplasm of breast: Secondary | ICD-10-CM | POA: Diagnosis not present

## 2018-07-08 DIAGNOSIS — E876 Hypokalemia: Secondary | ICD-10-CM | POA: Diagnosis not present

## 2018-07-08 DIAGNOSIS — Z1159 Encounter for screening for other viral diseases: Secondary | ICD-10-CM | POA: Diagnosis not present

## 2018-07-08 LAB — CBC WITH DIFFERENTIAL/PLATELET
Abs Immature Granulocytes: 0.03 10*3/uL (ref 0.00–0.07)
Basophils Absolute: 0 10*3/uL (ref 0.0–0.1)
Basophils Relative: 0 %
Eosinophils Absolute: 0 10*3/uL (ref 0.0–0.5)
Eosinophils Relative: 0 %
HCT: 36.3 % (ref 36.0–46.0)
Hemoglobin: 11.7 g/dL — ABNORMAL LOW (ref 12.0–15.0)
Immature Granulocytes: 0 %
Lymphocytes Relative: 22 %
Lymphs Abs: 2.5 10*3/uL (ref 0.7–4.0)
MCH: 28.7 pg (ref 26.0–34.0)
MCHC: 32.2 g/dL (ref 30.0–36.0)
MCV: 89 fL (ref 80.0–100.0)
Monocytes Absolute: 0.8 10*3/uL (ref 0.1–1.0)
Monocytes Relative: 7 %
Neutro Abs: 8.1 10*3/uL — ABNORMAL HIGH (ref 1.7–7.7)
Neutrophils Relative %: 71 %
Platelets: 269 10*3/uL (ref 150–400)
RBC: 4.08 MIL/uL (ref 3.87–5.11)
RDW: 13.1 % (ref 11.5–15.5)
WBC: 11.4 10*3/uL — ABNORMAL HIGH (ref 4.0–10.5)
nRBC: 0 % (ref 0.0–0.2)

## 2018-07-08 LAB — COMPREHENSIVE METABOLIC PANEL
ALT: 169 U/L — ABNORMAL HIGH (ref 0–44)
AST: 58 U/L — ABNORMAL HIGH (ref 15–41)
Albumin: 3.6 g/dL (ref 3.5–5.0)
Alkaline Phosphatase: 152 U/L — ABNORMAL HIGH (ref 38–126)
Anion gap: 10 (ref 5–15)
BUN: 14 mg/dL (ref 6–20)
CO2: 26 mmol/L (ref 22–32)
Calcium: 9.5 mg/dL (ref 8.9–10.3)
Chloride: 105 mmol/L (ref 98–111)
Creatinine, Ser: 0.66 mg/dL (ref 0.44–1.00)
GFR calc Af Amer: >60 mL/min (ref >60)
GFR calc non Af Amer: >60 mL/min (ref >60)
Glucose, Bld: 98 mg/dL (ref 70–99)
Potassium: 3.9 mmol/L (ref 3.5–5.1)
Sodium: 141 mmol/L (ref 135–145)
Total Bilirubin: 0.5 mg/dL (ref 0.3–1.2)
Total Protein: 7.1 g/dL (ref 6.5–8.1)

## 2018-07-08 LAB — MAGNESIUM: Magnesium: 2.2 mg/dL (ref 1.7–2.4)

## 2018-07-08 MED ORDER — ACETAMINOPHEN 325 MG PO TABS: 650.0000 mg | ORAL_TABLET | Freq: Four times a day (QID) | ORAL | 2 refills | Status: AC | PRN

## 2018-07-08 MED ORDER — HYDROMORPHONE HCL 1 MG/ML IJ SOLN: 0.5000 mg | INTRAMUSCULAR | Status: DC | PRN

## 2018-07-08 MED ORDER — TRAMADOL HCL 50 MG PO TABS: 50.0000 mg | ORAL_TABLET | Freq: Four times a day (QID) | ORAL | 0 refills | Status: AC | PRN

## 2018-07-08 NOTE — Progress Notes (Signed)
1 Day Post-Op   Subjective/Chief Complaint: Pain at umbilical site but otherwise feels well. Tolerating PO.    Objective: Vital signs in last 24 hours: Temp:  [97.6 F (36.4 C)-98.6 F (37 C)] 98.1 F (36.7 C) (06/28 0419) Pulse Rate:  [46-68] 48 (06/28 0500) Resp:  [14-18] 18 (06/28 0419) BP: (112-144)/(66-80) 133/74 (06/28 0419) SpO2:  [97 %-100 %] 100 % (06/28 0419) Weight:  [54.2 kg] 54.2 kg (06/28 0500) Last BM Date: 07/04/18  Intake/Output from previous day: 06/27 0701 - 06/28 0700 In: 2340 [P.O.:1440; I.V.:900] Out: 2100 [Urine:2100] Intake/Output this shift: No intake/output data recorded.  General appearance: alert and cooperative Resp: unlabored GI: soft, mildly appropriately tender around umbilical incision, nondistended Incision/Wound: c/d/i with dermabond  Lab Results:  Recent Labs    07/07/18 0322 07/08/18 0405  WBC 6.8 11.4*  HGB 11.5* 11.7*  HCT 35.3* 36.3  PLT 240 269   BMET Recent Labs    07/07/18 0322 07/08/18 0405  NA 141 141  K 3.4* 3.9  CL 109 105  CO2 23 26  GLUCOSE 89 98  BUN 8 14  CREATININE 0.47 0.66  CALCIUM 8.8* 9.5   PT/INR No results for input(s): LABPROT, INR in the last 72 hours. ABG No results for input(s): PHART, HCO3 in the last 72 hours.  Invalid input(s): PCO2, PO2  Studies/Results: Dg Cholangiogram Operative  Result Date: 07/07/2018 CLINICAL DATA:  53 year old female with a history of cholelithiasis EXAM: INTRAOPERATIVE CHOLANGIOGRAM TECHNIQUE: Cholangiographic images from the C-arm fluoroscopic device were submitted for interpretation post-operatively. Please see the procedural report for the amount of contrast and the fluoroscopy time utilized. COMPARISON:  None. FINDINGS: Surgical instruments project over the upper abdomen. There is cannulation of the cystic duct/gallbladder neck, with antegrade infusion of contrast. Caliber of the extrahepatic ductal system within normal limits. No definite filling defect  within the extrahepatic ducts identified. Free flow of contrast across the ampulla. IMPRESSION: Intraoperative cholangiogram demonstrates extrahepatic biliary ducts of unremarkable caliber, with no definite filling defects identified. Free flow of contrast across the ampulla. Please refer to the dictated operative report for full details of intraoperative findings and procedure Electronically Signed   By: Corrie Mckusick D.O.   On: 07/07/2018 08:29   [place] Abdomen Limited Ruq  Result Date: 07/06/2018 CLINICAL DATA:  Pancreatitis EXAM: ULTRASOUND ABDOMEN LIMITED RIGHT UPPER QUADRANT COMPARISON:  None. FINDINGS: Gallbladder: Multiple small stones layer in the dependent aspect of the gallbladder measured up to 5.7 mm diameter. No gallbladder wall thickening or pericholecystic fluid. Sonographer describes no sonographic Murphy's sign. Common bile duct: Diameter: 3 mm, unremarkable Liver: No focal lesion identified. Within normal limits in parenchymal echogenicity. Portal vein is patent on color Doppler imaging with normal direction of blood flow towards the liver. IMPRESSION: Cholelithiasis Electronically Signed   By: Lucrezia Europe M.D.   On: 07/06/2018 10:35    Anti-infectives: Anti-infectives (From admission, onward)   Start     Dose/Rate Route Frequency Ordered Stop   07/07/18 0600  cefTRIAXone (ROCEPHIN) 2 g in sodium chloride 0.9 % 100 mL IVPB     2 g 200 mL/hr over 30 Minutes Intravenous On call to O.R. 07/06/18 1527 07/07/18 0738      Assessment/Plan: s/p Procedure(s): LAPAROSCOPIC CHOLECYSTECTOMY withINTRAOPERATIVE CHOLANGIOGRAM (N/A) 07/07/18 Dr. Marlou Starks OK for disharge from surgery standpoint. Will put pain rx and instructions in.   LOS: 0 days    Clovis Riley 07/08/2018

## 2018-07-08 NOTE — Plan of Care (Signed)
  Problem: Education: Goal: Knowledge of General Education information will improve Description: Including pain rating scale, medication(s)/side effects and non-pharmacologic comfort measures Outcome: Completed/Met   Problem: Health Behavior/Discharge Planning: Goal: Ability to manage health-related needs will improve Outcome: Completed/Met   Problem: Clinical Measurements: Goal: Ability to maintain clinical measurements within normal limits will improve Outcome: Completed/Met Goal: Will remain free from infection Outcome: Completed/Met Goal: Diagnostic test results will improve Outcome: Completed/Met   Problem: Nutrition: Goal: Adequate nutrition will be maintained Outcome: Completed/Met   Problem: Coping: Goal: Level of anxiety will decrease Outcome: Completed/Met   Problem: Elimination: Goal: Will not experience complications related to bowel motility Outcome: Completed/Met   Problem: Pain Managment: Goal: General experience of comfort will improve Outcome: Completed/Met

## 2018-07-08 NOTE — Progress Notes (Signed)
Discharge instructions reviewed with patient.  These included the following:  Incision care, when to call the MD, post-op appointments, activity restrictions, dietary recommendations, pain control, prescriptions, medication administration changes, etc.  Comprehension of material presented was verified utilizing the "teach-back" technique.  Patient discharged to private residence via private vehicle driven by husband.  Escorted to exit via wheelchair by nurse tech.

## 2018-07-08 NOTE — Discharge Summary (Signed)
Discharge Summary  Annette Alvarado XTG:[id number] DOB: 12/18/1965  PCP: Darden Amber, PA  Admit date: 07/05/2018 Discharge date: 07/08/2018  Time spent: 2mins  Recommendations for Outpatient Follow-up:  1. F/u with PCP within a week  for hospital discharge follow up, repeat cbc/bmp at follow up 2. F/u with general surgery  Discharge Diagnoses:  Active Hospital Problems   Diagnosis Date Noted  . Abnormal liver function 07/06/2018  . Gallstone pancreatitis 07/08/2018  . Gallstones   . Hypokalemia   . Acute pancreatitis 07/06/2018    Resolved Hospital Problems  No resolved problems to display.    Discharge Condition: stable  Diet recommendation: regular diet  Filed Weights   07/05/18 2253 07/08/18 0500  Weight: 53.5 kg 54.2 kg    History of present illness: (per admitting MD Dr Maudie Mercury) Annette Alvarado  is a 53 y.o. female, who apparently went to urgent care yesterday and was told that she had pancreatitis and needed to come to ER for evaluation.  Pt currently denies fever, chills, abd pain, n/v, diarrhea, brbpr, black stool  In ED T 98.6,  P 67  Bp 135/81  Pox 99%  Wbc 9.1, Hgb 12.4, Plt 263 Na 142, K 3.5,  Bun 11, Creatinine 0.47   CT abd/ pelvis IMPRESSION: Acute pancreatitis. No abscess or pseudocyst.  Pt will be admitted for observation of acute pancreatitis and abnormal liver function testing.   Hospital Course:  Principal Problem:   Abnormal liver function Active Problems:   Acute pancreatitis   Gallstones   Hypokalemia   Gallstone pancreatitis   Gallstone pancreatitis: -pain resolved, lipase/lft coming down -ab [place] no CBD dilatation -GI consulted who recommend to proceed with surgery consult -general surgery consulted, s/p lap chole on 6/27   Hypokalemia Replaced ,normalized  SARS COV2 screening negative  Code Status: full  Family Communication: patient   Disposition Plan: home with  General surgery clearance   Consultants:  GI   General surgery  Procedures:  Lap chole on 6/27  Antibiotics:  perioperative    Discharge Exam: BP 133/74 (BP Location: Left Arm)   Pulse (!) 48   Temp 98.1 F (36.7 C) (Oral)   Resp 18   Ht 5\' 1"  (1.549 m)   Wt 54.2 kg   SpO2 100%   BMI 22.58 kg/m   General: NAD Cardiovascular: RRR Respiratory: CTABL  Discharge Instructions You were cared for by a hospitalist during your hospital stay. If you have any questions about your discharge medications or the care you received while you were in the hospital after you are discharged, you can call the unit and asked to speak with the hospitalist on call if the hospitalist that took care of you is not available. Once you are discharged, your primary care physician will handle any further medical issues. Please note that NO REFILLS for any discharge medications will be authorized once you are discharged, as it is imperative that you return to your primary care physician (or establish a relationship with a primary care physician if you Le[place] not have one) for your aftercare needs so that they can reassess your need for medications and monitor your lab values.  Discharge Instructions    Diet general   Complete by: As directed    Increase activity slowly   Complete by: As directed      Allergies as of 07/08/2018   No Known Allergies     Medication List    STOP taking these medications   HYDROcodone-acetaminophen 5-325 MG  tablet Commonly known as: NORCO/VICODIN     TAKE these medications   acetaminophen 325 MG tablet Commonly known as: Tylenol Take 2 tablets (650 mg total) by mouth every 6 (six) hours as needed for mild pain or moderate pain.   omeprazole 40 MG capsule Commonly known as: PriLOSEC Take one cap by mouth once daily, 20 minutes before your evening meal   traMADol 50 MG tablet Commonly known as: Ultram Take 1 tablet (50 mg total) by mouth every 6 (six) hours as needed.      No Known Allergies Follow-up  Information    [facility] Follow up in 3 week(s).   Specialty: General Surgery Contact information: [address] [place] [address] [phone]        Christ Kickrvin, Alison M, PA Follow up in 1 week(s).   Why: hospital discharge follow up, repeat labs cbc/cmp at follow up. Contact information: [address] [place] [address] [phone]            The results of significant diagnostics from this hospitalization (including imaging, microbiology, ancillary and laboratory) are listed below for reference.    Significant Diagnostic Studies: Dg Cholangiogram Operative  Result Date: 07/07/2018 CLINICAL DATA:  53 year old female with a history of cholelithiasis EXAM: INTRAOPERATIVE CHOLANGIOGRAM TECHNIQUE: Cholangiographic images from the C-arm fluoroscopic device were submitted for interpretation post-operatively. Please see the procedural report for the amount of contrast and the fluoroscopy time utilized. COMPARISON:  None. FINDINGS: Surgical instruments project over the upper abdomen. There is cannulation of the cystic duct/gallbladder neck, with antegrade infusion of contrast. Caliber of the extrahepatic ductal system within normal limits. No definite filling defect within the extrahepatic ducts identified. Free flow of contrast across the ampulla. IMPRESSION: Intraoperative cholangiogram demonstrates extrahepatic biliary ducts of unremarkable caliber, with no definite filling defects identified. Free flow of contrast across the ampulla. Please refer to the dictated operative report for full details of intraoperative findings and procedure Electronically Signed   By: Gilmer MorJaime  Wagner D.O.   On: 07/07/2018 08:29   Ct Abdomen Pelvis W Contrast  Result Date: 07/06/2018 CLINICAL DATA:  53 year old female with epigastric pain and elevated lipase. EXAM: CT ABDOMEN AND PELVIS WITH CONTRAST TECHNIQUE: Multidetector CT imaging of the abdomen and pelvis was  performed using the standard protocol following bolus administration of intravenous contrast. CONTRAST:  80mL OMNIPAQUE IOHEXOL 300 MG/ML  SOLN COMPARISON:  None. FINDINGS: Evaluation of this exam is limited due to respiratory motion artifact. Lower chest: Minimal bibasilar dependent atelectatic changes. The visualized lung bases are otherwise clear. No intra-abdominal free air or free fluid. Hepatobiliary: The liver is unremarkable. No intrahepatic biliary ductal dilatation. The gallbladder is unremarkable. Pancreas: There diffuse inflammatory changes of the pancreas consistent with pancreatitis. No fluid collection/abscess or pseudocyst. Spleen: Normal in size without focal abnormality. Adrenals/Urinary Tract: Adrenal glands are unremarkable. Kidneys are normal, without renal calculi, focal lesion, or hydronephrosis. Bladder is unremarkable. Stomach/Bowel: There is no bowel obstruction or active inflammation. Normal appendix. Moderate colonic stool burden. Vascular/Lymphatic: No significant vascular findings are present. No enlarged abdominal or pelvic lymph nodes. Reproductive: Uterus and bilateral adnexa are unremarkable. Other: None Musculoskeletal: No acute or significant osseous findings. IMPRESSION: Acute pancreatitis.  No abscess or pseudocyst. Electronically Signed   By: Elgie CollardArash  Radparvar M.D.   On: 07/06/2018 03:11   [place] Abdomen Limited Ruq  Result Date: 07/06/2018 CLINICAL DATA:  Pancreatitis EXAM: ULTRASOUND ABDOMEN LIMITED RIGHT UPPER QUADRANT COMPARISON:  None. FINDINGS: Gallbladder: Multiple small stones  layer in the dependent aspect of the gallbladder measured up to 5.7 mm diameter. No gallbladder wall thickening or pericholecystic fluid. Sonographer describes no sonographic Murphy's sign. Common bile duct: Diameter: 3 mm, unremarkable Liver: No focal lesion identified. Within normal limits in parenchymal echogenicity. Portal vein is patent on color Doppler imaging with normal direction of blood  flow towards the liver. IMPRESSION: Cholelithiasis Electronically Signed   By: Corlis Leak  Hassell M.D.   On: 07/06/2018 10:35    Microbiology: Recent Results (from the past 240 hour(s))  SARS Coronavirus 2 (CEPHEID - Performed in [facility] hospital lab), Hosp Order     Status: None   Collection Time: 07/06/18  4:00 AM   Specimen: Nasopharyngeal Swab  Result Value Ref Range Status   SARS Coronavirus 2 NEGATIVE NEGATIVE Final    Comment: (NOTE) If result is NEGATIVE SARS-CoV-2 target nucleic acids are NOT DETECTED. The SARS-CoV-2 RNA is generally detectable in upper and lower  respiratory specimens during the acute phase of infection. The lowest  concentration of SARS-CoV-2 viral copies this assay can detect is 250  copies / mL. A negative result does not preclude SARS-CoV-2 infection  and should not be used as the sole basis for treatment or other  patient management decisions.  A negative result may occur with  improper specimen collection / handling, submission of specimen other  than nasopharyngeal swab, presence of viral mutation(s) within the  areas targeted by this assay, and inadequate number of viral copies  (<250 copies / mL). A negative result must be combined with clinical  observations, patient history, and epidemiological information. If result is POSITIVE SARS-CoV-2 target nucleic acids are DETECTED. The SARS-CoV-2 RNA is generally detectable in upper and lower  respiratory specimens dur ing the acute phase of infection.  Positive  results are indicative of active infection with SARS-CoV-2.  Clinical  correlation with patient history and other diagnostic information is  necessary to determine patient infection status.  Positive results Ma[place]  not rule out bacterial infection or co-infection with other viruses. If result is PRESUMPTIVE POSTIVE SARS-CoV-2 nucleic acids MAY BE PRESENT.   A presumptive positive result was obtained on the submitted specimen  and confirmed on repeat  testing.  While 2019 novel coronavirus  (SARS-CoV-2) nucleic acids may be present in the submitted sample  additional confirmatory testing may be necessary for epidemiological  and / or clinical management purposes  to differentiate between  SARS-CoV-2 and other Sarbecovirus currently known to infect humans.  If clinically indicated additional testing with an alternate test  methodology [phone]([id number]) is advised. The SARS-CoV-2 RNA is generally  detectable in upper and lower respiratory sp ecimens during the acute  phase of infection. The expected result is Negative. Fact Sheet for Patients:  BoilerBrush.com.cyhttps://www.fda.gov/media/136312/download Fact Sheet for Healthcare Providers: https://pope.com/https://www.fda.gov/media/136313/download This test is not yet approved or cleared by the [place] FDA and has been authorized for detection and/or diagnosis of SARS-CoV-2 by FDA under an Emergency Use Authorization (EUA).  This EUA will remain in effect (meaning this test can be used) for the duration of the COVID-19 declaration under Section 564(b)(1) of the Act, 21 U.S.C. section 360bbb-3(b)(1), unless the authorization is terminated or revoked sooner. Performed at [facility], [address] [place], [place] [address]   Surgical PCR screen     Status: None   Collection Time: 07/06/18  8:05 PM   Specimen: Nasal Mucosa; Nasal Swab  Result Value Ref Range Status   MRSA, PCR NEGATIVE NEGATIVE Final  Staphylococcus aureus NEGATIVE NEGATIVE Final    Comment: (NOTE) The Xpert SA Assay (FDA approved for NASAL specimens in patients 51 years of age and older), is one component of a comprehensive surveillance program. It is not intended to diagnose infection nor to guide or monitor treatment. Performed at [facility], [address] [place], [place] [address]      Labs: Basic Metabolic Panel: Recent Labs  Lab 07/04/18 0953 07/06/18 0010 07/07/18 0322 07/08/18 0405  NA  138 142 141 141  K 4.5 3.5 3.4* 3.9  CL 103 108 109 105  CO2 27 23 23 26   GLUCOSE 125* 101* 89 98  BUN 18 11 8 14   CREATININE 0.47* 0.47 0.47 0.66  CALCIUM 9.6 9.4 8.8* 9.5  MG  --   --   --  2.2   Liver Function Tests: Recent Labs  Lab 07/04/18 0953 07/06/18 0010 07/07/18 0322 07/08/18 0405  AST 688* 135* 54* 58*  ALT 442* 304* 184* 169*  ALKPHOS  --  184* 139* 152*  BILITOT 0.8 0.4 0.4 0.5  PROT 7.6 7.8 6.7 7.1  ALBUMIN  --  4.0 3.4* 3.6   Recent Labs  Lab 07/04/18 0953 07/06/18 0010  LIPASE >6,000* 61*  AMYLASE 2,590*  --    No results for input(s): AMMONIA in the last 168 hours. CBC: Recent Labs  Lab 07/06/18 0010 07/07/18 0322 07/08/18 0405  WBC 9.1 6.8 11.4*  NEUTROABS 6.3  --  8.1*  HGB 12.4 11.5* 11.7*  HCT 38.1 35.3* 36.3  MCV 88.8 89.6 89.0  PLT 263 240 269   Cardiac Enzymes: No results for input(s): CKTOTAL, CKMB, CKMBINDEX, TROPONINI in the last 168 hours. BNP: BNP (last 3 results) No results for input(s): BNP in the last 8760 hours.  ProBNP (last 3 results) No results for input(s): PROBNP in the last 8760 hours.  CBG: No results for input(s): GLUCAP in the last 168 hours.     Signed:  Albertine GratesFang Mayreli Alden MD, PhD  [facility] 07/08/2018, 5:33 PM

## 2018-07-08 NOTE — Discharge Instructions (Signed)
[facility] ______[facility] °LAPAROSCOPIC SURGERY: POST OP INSTRUCTIONS °Always review your discharge instruction sheet given to you by the facility where your surgery was performed. °IF YOU HAVE DISABILITY OR FAMILY LEAVE FORMS, YOU MUST BRING THEM TO THE OFFICE FOR PROCESSING.   °Jacober NOT GIVE THEM TO YOUR DOCTOR. ° °1. A prescription for pain medication may be given to you upon discharge.  Take your pain medication as prescribed, if needed.  If narcotic pain medicine is not needed, then you may take acetaminophen (Tylenol) or ibuprofen (Advil) as needed. °2. Take your usually prescribed medications unless otherwise directed. °3. If you need a refill on your pain medication, please contact your pharmacy.  They will contact our office to request authorization. Prescriptions will not be filled after 5pm or on week-ends. °4. You should follow a light diet the first few days after arrival home, such as soup and crackers, etc.  Be sure to include lots of fluids daily. °5. Most patients will experience some swelling and bruising in the area of the incisions.  Ice packs will help.  Swelling and bruising can take several days to resolve.  °6. It is common to experience some constipation if taking pain medication after surgery.  Increasing fluid intake and taking a stool softener (such as Colace) will usually help or prevent this problem from occurring.  A mild laxative (Milk of Magnesia or Miralax) should be taken according to package instructions if there are no bowel movements after 48 hours. °7. Unless discharge instructions indicate otherwise, you may remove your bandages 24-48 hours after surgery, and you may shower at that time.  You may have steri-strips (small skin tapes) in place directly over the incision.  These strips should be left on the skin for 7-10 days.  If your surgeon used skin glue on the incision, you may shower in 24 hours.  The glue will flake off over the next 2-3 weeks.  Any sutures or  staples will be removed at the office during your follow-up visit. °8. ACTIVITIES:  You may resume regular (light) daily activities beginning the next day--such as daily self-care, walking, climbing stairs--gradually increasing activities as tolerated.  You may have sexual intercourse when it is comfortable.  Refrain from any heavy lifting or straining until approved by your doctor. °a. You may drive when you are no longer taking prescription pain medication, you can comfortably wear a seatbelt, and you can safely maneuver your car and apply brakes. °b. RETURN TO WORK:  ____1-2 weeks__________________________________________________ °9. You should see your doctor in the office for a follow-up appointment approximately 2-3 weeks after your surgery.  Make sure that you call for this appointment within a day or two after you arrive home to insure a convenient appointment time. °10. OTHER INSTRUCTIONS: __________________________________________________________________________________________________________________________ __________________________________________________________________________________________________________________________ °WHEN TO CALL YOUR DOCTOR: °1. Fever over 101.0 °2. Inability to urinate °3. Continued bleeding from incision. °4. Increased pain, redness, or drainage from the incision. °5. Increasing abdominal pain ° °The clinic staff is available to answer your questions during regular business hours.  Please don’t hesitate to call and ask to speak to one of the nurses for clinical concerns.  If you have a medical emergency, go to the nearest emergency room or call 911.  A surgeon from [facility] is always on call at the hospital. °[address], [address], [place], [place]  [address] ? [address] [address], [place], [place]   [address] °[phone] ? [phone] ? FAX [phone] °Web   site: [email or web address] ° °

## 2018-08-10 ENCOUNTER — Other Ambulatory Visit: Payer: Self-pay

## 2018-08-10 ENCOUNTER — Ambulatory Visit
Admission: RE | Admit: 2018-08-10 | Discharge: 2018-08-10 | Disposition: A | Discharge status: Home / Self Care | Payer: BLUE CROSS/BLUE SHIELD | Source: Ambulatory Visit | Attending: *Deleted | Admitting: *Deleted

## 2018-08-10 DIAGNOSIS — Z1231 Encounter for screening mammogram for malignant neoplasm of breast: Secondary | ICD-10-CM

## 2019-10-03 ENCOUNTER — Other Ambulatory Visit: Payer: Self-pay | Admitting: Family Medicine

## 2019-10-03 ENCOUNTER — Other Ambulatory Visit: Payer: Self-pay | Admitting: *Deleted

## 2019-10-03 DIAGNOSIS — Z1231 Encounter for screening mammogram for malignant neoplasm of breast: Secondary | ICD-10-CM

## 2019-10-25 ENCOUNTER — Other Ambulatory Visit: Payer: Self-pay

## 2019-10-25 ENCOUNTER — Ambulatory Visit
Admission: RE | Admit: 2019-10-25 | Discharge: 2019-10-25 | Disposition: A | Discharge status: Home / Self Care | Payer: 59 | Source: Ambulatory Visit | Attending: Family Medicine | Admitting: Family Medicine

## 2019-10-25 DIAGNOSIS — Z1231 Encounter for screening mammogram for malignant neoplasm of breast: Secondary | ICD-10-CM

## 2020-11-17 ENCOUNTER — Other Ambulatory Visit: Payer: Self-pay | Admitting: Family Medicine

## 2020-11-17 DIAGNOSIS — Z1231 Encounter for screening mammogram for malignant neoplasm of breast: Secondary | ICD-10-CM

## 2020-12-24 ENCOUNTER — Other Ambulatory Visit: Payer: Self-pay

## 2020-12-24 ENCOUNTER — Ambulatory Visit: Payer: BLUE CROSS/BLUE SHIELD

## 2021-12-14 IMAGING — MG DIGITAL SCREENING BILAT W/ CAD
4 series · 4 of 4 positions shown · non-contrast
Comparison: Previous exam(s).

CLINICAL DATA: Screening.

EXAM:
DIGITAL SCREENING BILATERAL MAMMOGRAM WITH CAD

[L CC]
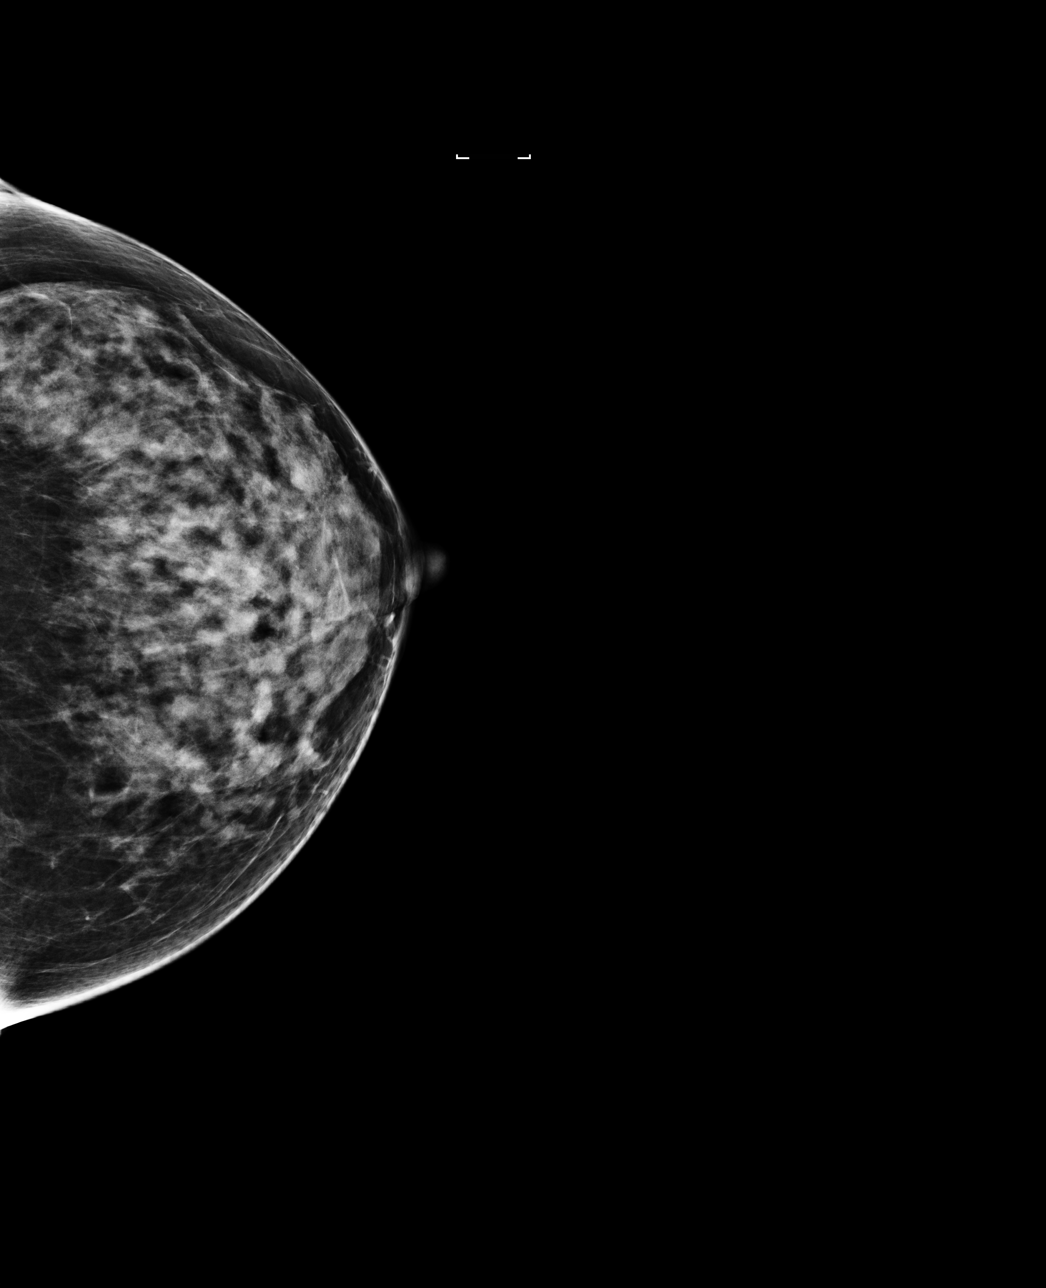

[L MLO]
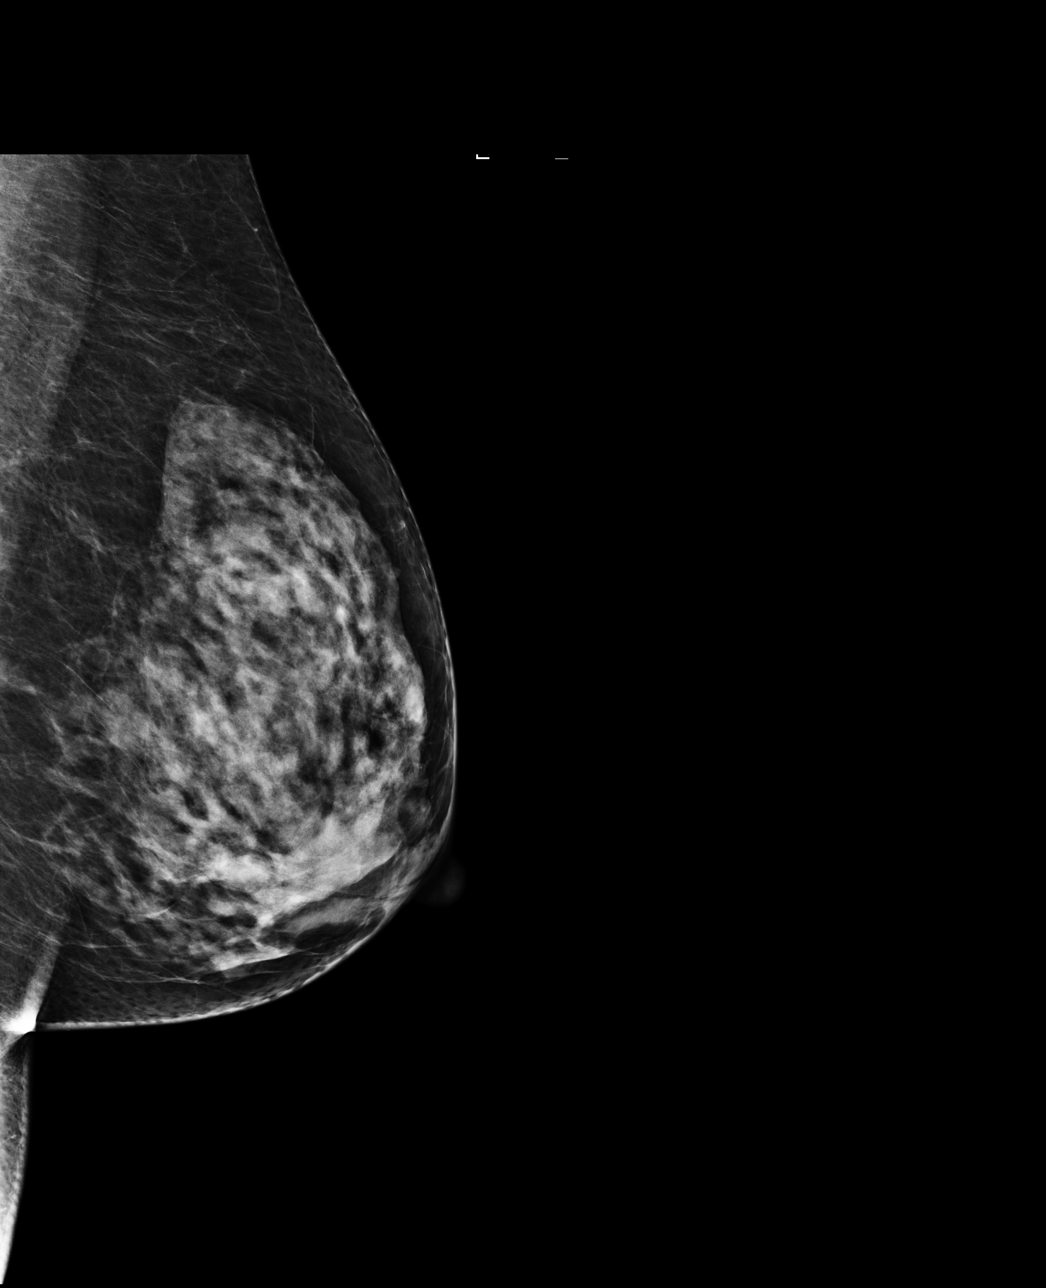

[R CC]
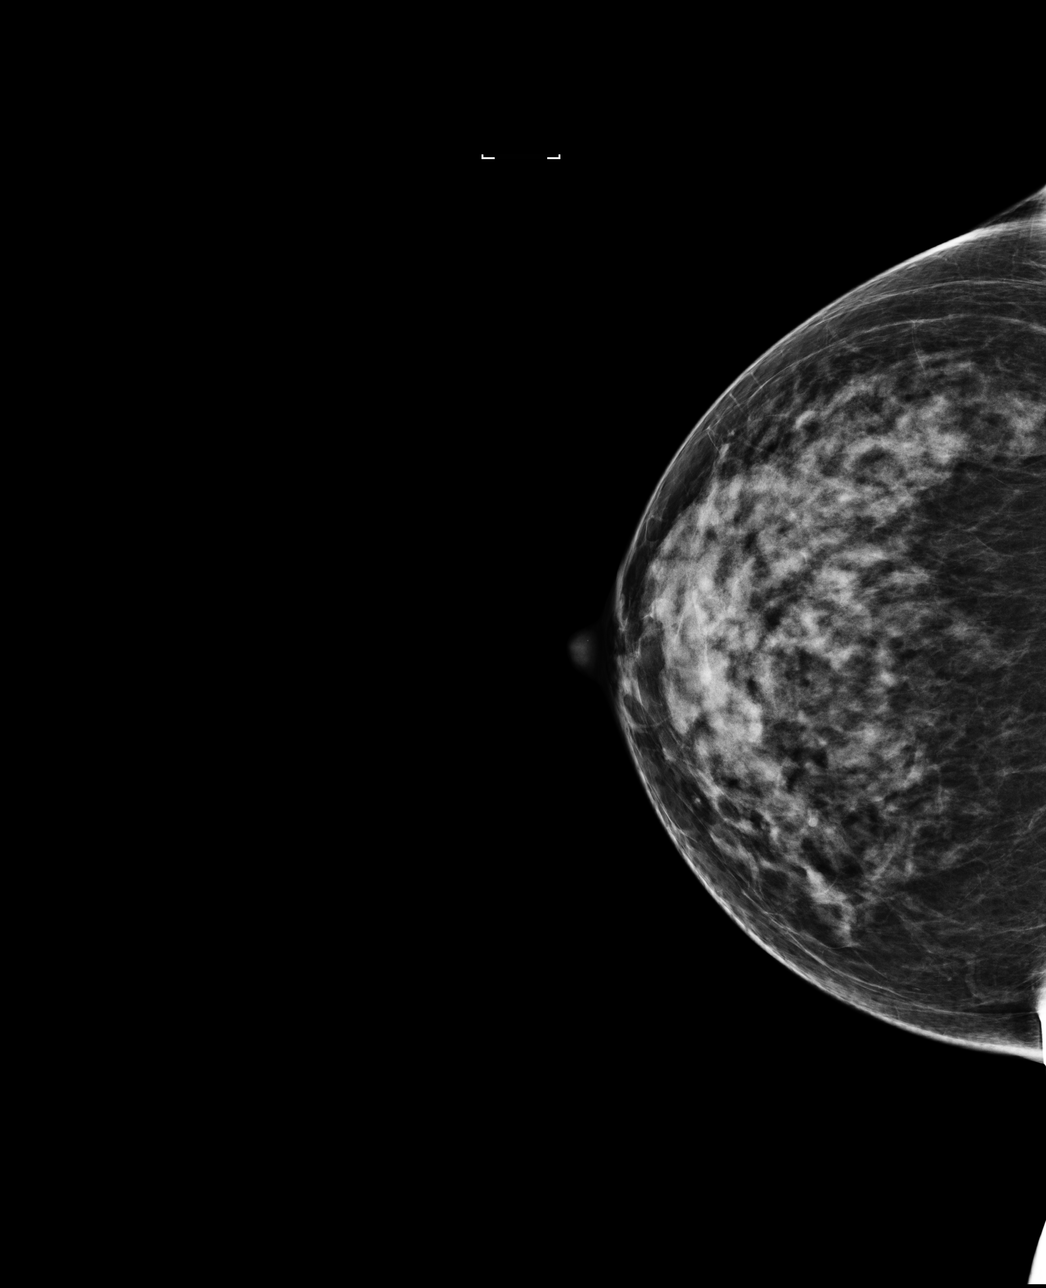

[R MLO]
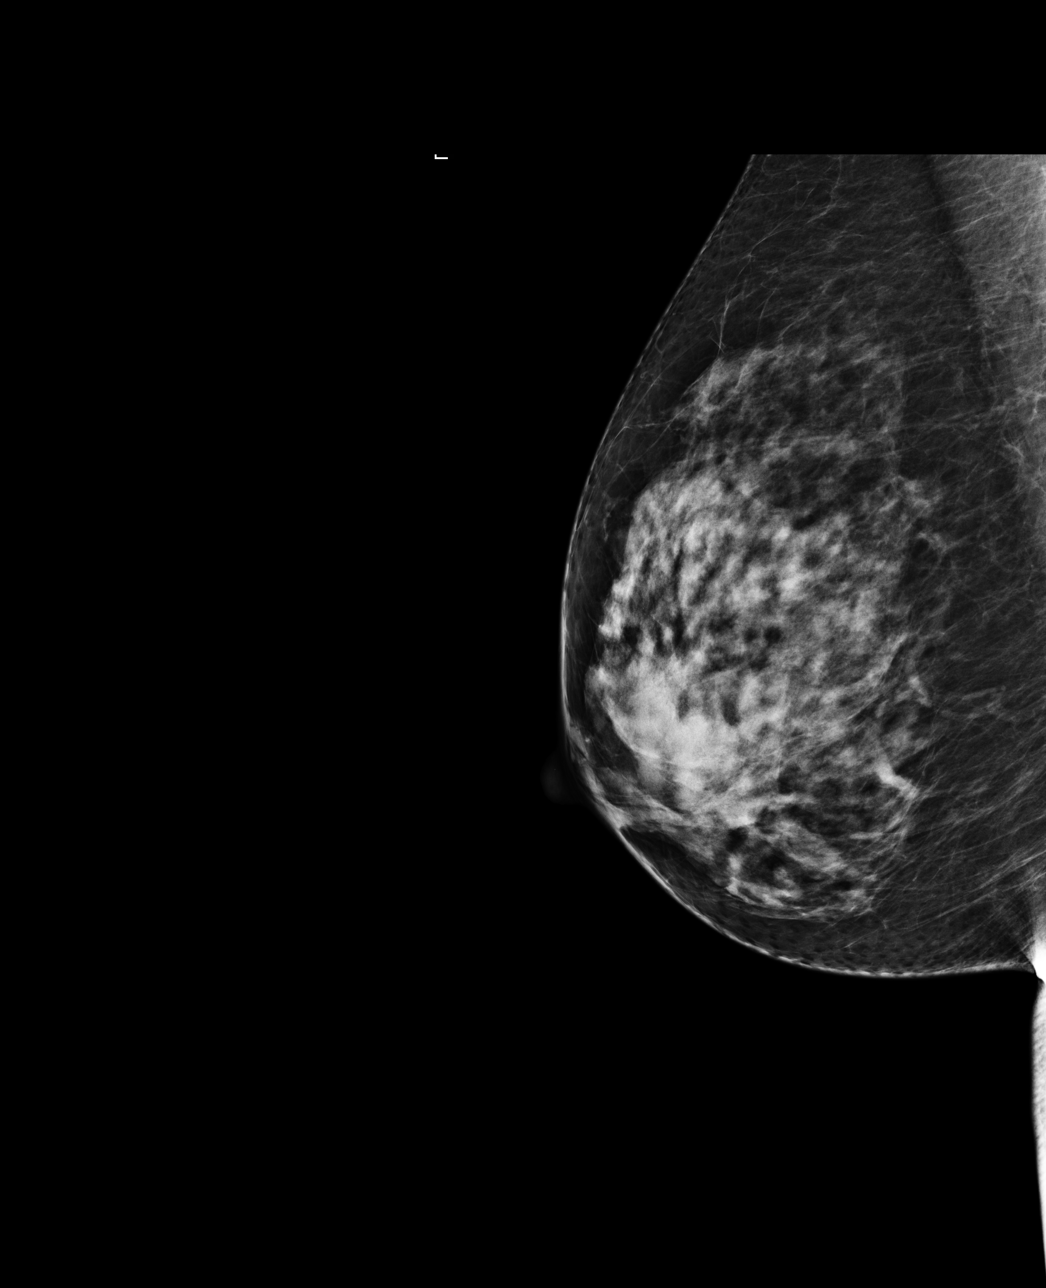

[4 of 4 positions shown; findings below may reference images not displayed]

ACR Breast Density Category c: The breast tissue is heterogeneously
dense, which may obscure small masses.
FINDINGS: There are no findings suspicious for malignancy. Images were
processed with CAD.
IMPRESSION: No mammographic evidence of malignancy. A result letter of this
screening mammogram will be mailed directly to the patient.

RECOMMENDATION:
Screening mammogram in one year. (Code:[id number])

BI-RADS CATEGORY  1: Negative.
